# Patient Record
Sex: Female | Born: 1945 | Race: White | Hispanic: No | Smoking: Former smoker
Health system: Southern US, Community
[De-identification: ages and names within clinical notes are randomized; demographics above are authoritative.]

## PROBLEM LIST (undated history)

## (undated) DIAGNOSIS — D649 Anemia, unspecified: Secondary | ICD-10-CM

## (undated) DIAGNOSIS — H40009 Preglaucoma, unspecified, unspecified eye: Secondary | ICD-10-CM

## (undated) DIAGNOSIS — F32A Depression, unspecified: Secondary | ICD-10-CM

## (undated) DIAGNOSIS — Z87442 Personal history of urinary calculi: Secondary | ICD-10-CM

## (undated) DIAGNOSIS — K831 Obstruction of bile duct: Secondary | ICD-10-CM

## (undated) DIAGNOSIS — I6523 Occlusion and stenosis of bilateral carotid arteries: Secondary | ICD-10-CM

## (undated) DIAGNOSIS — M1990 Unspecified osteoarthritis, unspecified site: Secondary | ICD-10-CM

## (undated) DIAGNOSIS — R7303 Prediabetes: Secondary | ICD-10-CM

## (undated) DIAGNOSIS — H269 Unspecified cataract: Secondary | ICD-10-CM

## (undated) DIAGNOSIS — F419 Anxiety disorder, unspecified: Secondary | ICD-10-CM

## (undated) DIAGNOSIS — E785 Hyperlipidemia, unspecified: Secondary | ICD-10-CM

## (undated) DIAGNOSIS — I480 Paroxysmal atrial fibrillation: Secondary | ICD-10-CM

## (undated) DIAGNOSIS — I499 Cardiac arrhythmia, unspecified: Secondary | ICD-10-CM

## (undated) DIAGNOSIS — K219 Gastro-esophageal reflux disease without esophagitis: Secondary | ICD-10-CM

## (undated) DIAGNOSIS — E119 Type 2 diabetes mellitus without complications: Secondary | ICD-10-CM

## (undated) DIAGNOSIS — E039 Hypothyroidism, unspecified: Secondary | ICD-10-CM

## (undated) DIAGNOSIS — Z87891 Personal history of nicotine dependence: Principal | ICD-10-CM

## (undated) HISTORY — PX: CARDIAC ELECTROPHYSIOLOGY MAPPING AND ABLATION: SHX1292

## (undated) HISTORY — DX: Hypothyroidism, unspecified: E03.9

## (undated) HISTORY — DX: Hyperlipidemia, unspecified: E78.5

## (undated) HISTORY — PX: APPENDECTOMY: SHX54

## (undated) HISTORY — DX: Personal history of nicotine dependence: Z87.891

## (undated) HISTORY — PX: ABDOMINAL HYSTERECTOMY: SHX81

---

## 1971-12-31 HISTORY — PX: APPENDECTOMY: SHX54

## 1982-12-30 HISTORY — PX: ABDOMINAL HYSTERECTOMY: SHX81

## 2005-04-23 ENCOUNTER — Emergency Department: Payer: Self-pay | Admitting: General Practice

## 2005-08-27 ENCOUNTER — Ambulatory Visit: Payer: Self-pay | Admitting: Obstetrics and Gynecology

## 2006-08-28 ENCOUNTER — Ambulatory Visit: Payer: Self-pay | Admitting: Obstetrics and Gynecology

## 2006-11-03 ENCOUNTER — Other Ambulatory Visit: Payer: Self-pay

## 2006-11-03 ENCOUNTER — Emergency Department: Payer: Self-pay | Admitting: Emergency Medicine

## 2006-12-30 HISTORY — PX: COLONOSCOPY WITH ESOPHAGOGASTRODUODENOSCOPY (EGD): SHX5779

## 2006-12-30 HISTORY — PX: COLON SURGERY: SHX602

## 2007-02-26 ENCOUNTER — Ambulatory Visit: Payer: Self-pay | Admitting: Internal Medicine

## 2007-02-28 ENCOUNTER — Ambulatory Visit: Payer: Self-pay | Admitting: Internal Medicine

## 2007-03-31 ENCOUNTER — Ambulatory Visit: Payer: Self-pay | Admitting: Internal Medicine

## 2007-04-30 ENCOUNTER — Ambulatory Visit: Payer: Self-pay | Admitting: Internal Medicine

## 2007-07-15 ENCOUNTER — Ambulatory Visit: Payer: Self-pay | Admitting: Unknown Physician Specialty

## 2007-08-04 ENCOUNTER — Ambulatory Visit: Payer: Self-pay | Admitting: Unknown Physician Specialty

## 2007-08-26 ENCOUNTER — Other Ambulatory Visit: Payer: Self-pay

## 2007-08-26 ENCOUNTER — Ambulatory Visit: Payer: Self-pay | Admitting: General Surgery

## 2007-09-02 ENCOUNTER — Inpatient Hospital Stay: Payer: Self-pay | Admitting: General Surgery

## 2007-09-04 ENCOUNTER — Other Ambulatory Visit: Payer: Self-pay

## 2007-10-21 ENCOUNTER — Ambulatory Visit: Payer: Self-pay

## 2007-11-23 ENCOUNTER — Other Ambulatory Visit: Payer: Self-pay

## 2007-11-23 ENCOUNTER — Emergency Department: Payer: Self-pay | Admitting: Emergency Medicine

## 2008-01-11 ENCOUNTER — Other Ambulatory Visit: Payer: Self-pay

## 2008-01-11 ENCOUNTER — Emergency Department: Payer: Self-pay | Admitting: Internal Medicine

## 2008-06-03 ENCOUNTER — Ambulatory Visit: Payer: Self-pay | Admitting: Internal Medicine

## 2008-11-12 ENCOUNTER — Emergency Department: Payer: Self-pay | Admitting: Emergency Medicine

## 2008-12-14 ENCOUNTER — Ambulatory Visit: Payer: Self-pay | Admitting: Obstetrics and Gynecology

## 2009-11-16 ENCOUNTER — Emergency Department: Payer: Self-pay | Admitting: Unknown Physician Specialty

## 2009-12-21 ENCOUNTER — Ambulatory Visit: Payer: Self-pay | Admitting: Obstetrics and Gynecology

## 2010-12-25 ENCOUNTER — Ambulatory Visit: Payer: Self-pay | Admitting: Obstetrics and Gynecology

## 2010-12-30 HISTORY — PX: TRIGGER FINGER RELEASE: SHX641

## 2011-06-25 ENCOUNTER — Ambulatory Visit: Payer: Self-pay | Admitting: Internal Medicine

## 2011-12-30 ENCOUNTER — Ambulatory Visit: Payer: Self-pay | Admitting: Obstetrics and Gynecology

## 2012-03-26 ENCOUNTER — Emergency Department: Payer: Self-pay | Admitting: Emergency Medicine

## 2012-03-26 LAB — CBC
MCH: 29.6 pg (ref 26.0–34.0)
MCHC: 33.8 g/dL (ref 32.0–36.0)
Platelet: 255 10*3/uL (ref 150–440)
RBC: 4.27 10*6/uL (ref 3.80–5.20)
RDW: 13.3 % (ref 11.5–14.5)
WBC: 10.3 10*3/uL (ref 3.6–11.0)

## 2012-03-26 LAB — BASIC METABOLIC PANEL
Anion Gap: 12 (ref 7–16)
BUN: 14 mg/dL (ref 7–18)
EGFR (African American): >60
EGFR (Non-African Amer.): >60
Glucose: 148 mg/dL — ABNORMAL HIGH (ref 65–99)
Osmolality: 286 (ref 275–301)
Potassium: 4 mmol/L (ref 3.5–5.1)
Sodium: 142 mmol/L (ref 136–145)

## 2012-03-26 LAB — TSH: Thyroid Stimulating Horm: 3.14 u[IU]/mL

## 2012-03-26 LAB — TROPONIN I: Troponin-I: <0.02 ng/mL

## 2012-04-16 ENCOUNTER — Ambulatory Visit: Payer: Self-pay | Admitting: Cardiology

## 2012-10-09 ENCOUNTER — Ambulatory Visit: Payer: Self-pay

## 2012-10-30 ENCOUNTER — Ambulatory Visit: Payer: Self-pay

## 2012-11-29 ENCOUNTER — Ambulatory Visit: Payer: Self-pay

## 2012-12-30 DIAGNOSIS — B9681 Helicobacter pylori [H. pylori] as the cause of diseases classified elsewhere: Secondary | ICD-10-CM

## 2012-12-30 DIAGNOSIS — K259 Gastric ulcer, unspecified as acute or chronic, without hemorrhage or perforation: Secondary | ICD-10-CM

## 2012-12-30 DIAGNOSIS — I471 Supraventricular tachycardia, unspecified: Secondary | ICD-10-CM

## 2012-12-30 HISTORY — DX: Gastric ulcer, unspecified as acute or chronic, without hemorrhage or perforation: K25.9

## 2012-12-30 HISTORY — DX: Supraventricular tachycardia, unspecified: I47.10

## 2012-12-30 HISTORY — PX: CARDIAC ELECTROPHYSIOLOGY MAPPING AND ABLATION: SHX1292

## 2012-12-30 HISTORY — DX: Helicobacter pylori (H. pylori) as the cause of diseases classified elsewhere: B96.81

## 2012-12-31 ENCOUNTER — Ambulatory Visit: Payer: Self-pay | Admitting: Obstetrics and Gynecology

## 2013-01-11 ENCOUNTER — Emergency Department: Payer: Self-pay | Admitting: Emergency Medicine

## 2013-01-11 LAB — CBC
MCH: 29.3 pg (ref 26.0–34.0)
RBC: 4.22 10*6/uL (ref 3.80–5.20)
RDW: 13.8 % (ref 11.5–14.5)
WBC: 11.7 10*3/uL — ABNORMAL HIGH (ref 3.6–11.0)

## 2013-01-11 LAB — COMPREHENSIVE METABOLIC PANEL
Alkaline Phosphatase: 195 U/L — ABNORMAL HIGH (ref 50–136)
Anion Gap: 8 (ref 7–16)
BUN: 15 mg/dL (ref 7–18)
Bilirubin,Total: 0.2 mg/dL (ref 0.2–1.0)
Chloride: 108 mmol/L — ABNORMAL HIGH (ref 98–107)
Co2: 25 mmol/L (ref 21–32)
Creatinine: 0.7 mg/dL (ref 0.60–1.30)
EGFR (African American): >60
Osmolality: 283 (ref 275–301)
SGOT(AST): 26 U/L (ref 15–37)
SGPT (ALT): 31 U/L (ref 12–78)
Sodium: 141 mmol/L (ref 136–145)

## 2013-01-11 LAB — URINALYSIS, COMPLETE
Bacteria: NONE SEEN
Blood: NEGATIVE
Ketone: NEGATIVE
Nitrite: NEGATIVE
Protein: NEGATIVE
Specific Gravity: 1.014 (ref 1.003–1.030)
WBC UR: 3 /HPF (ref 0–5)

## 2013-01-11 LAB — TROPONIN I: Troponin-I: <0.02 ng/mL

## 2013-05-28 ENCOUNTER — Emergency Department: Payer: Self-pay | Admitting: Emergency Medicine

## 2013-05-28 LAB — CBC
HGB: 13 g/dL (ref 12.0–16.0)
MCHC: 34.2 g/dL (ref 32.0–36.0)
MCV: 87 fL (ref 80–100)
Platelet: 198 10*3/uL (ref 150–440)
RDW: 13.6 % (ref 11.5–14.5)
WBC: 9.9 10*3/uL (ref 3.6–11.0)

## 2013-05-28 LAB — TROPONIN I: Troponin-I: <0.02 ng/mL

## 2013-05-28 LAB — TSH: Thyroid Stimulating Horm: 3.1 u[IU]/mL

## 2013-06-15 DIAGNOSIS — I471 Supraventricular tachycardia, unspecified: Secondary | ICD-10-CM | POA: Insufficient documentation

## 2013-06-15 DIAGNOSIS — E785 Hyperlipidemia, unspecified: Secondary | ICD-10-CM | POA: Insufficient documentation

## 2013-06-15 DIAGNOSIS — R002 Palpitations: Secondary | ICD-10-CM | POA: Insufficient documentation

## 2013-10-29 ENCOUNTER — Ambulatory Visit: Payer: Self-pay | Admitting: Orthopedic Surgery

## 2014-12-30 DIAGNOSIS — K769 Liver disease, unspecified: Secondary | ICD-10-CM

## 2014-12-30 HISTORY — DX: Liver disease, unspecified: K76.9

## 2015-01-09 ENCOUNTER — Ambulatory Visit: Payer: Self-pay | Admitting: Orthopedic Surgery

## 2015-01-16 DIAGNOSIS — F411 Generalized anxiety disorder: Secondary | ICD-10-CM | POA: Insufficient documentation

## 2015-01-16 DIAGNOSIS — G8929 Other chronic pain: Secondary | ICD-10-CM | POA: Insufficient documentation

## 2015-01-16 DIAGNOSIS — R7989 Other specified abnormal findings of blood chemistry: Secondary | ICD-10-CM | POA: Insufficient documentation

## 2015-08-24 ENCOUNTER — Encounter: Payer: Self-pay | Admitting: Family Medicine

## 2015-08-24 ENCOUNTER — Other Ambulatory Visit: Payer: Self-pay | Admitting: Family Medicine

## 2015-08-24 DIAGNOSIS — Z87891 Personal history of nicotine dependence: Secondary | ICD-10-CM | POA: Insufficient documentation

## 2015-08-24 HISTORY — DX: Personal history of nicotine dependence: Z87.891

## 2015-08-25 ENCOUNTER — Ambulatory Visit
Admission: RE | Admit: 2015-08-25 | Discharge: 2015-08-25 | Disposition: A | Discharge status: Home / Self Care | Payer: Medicare Other | Source: Ambulatory Visit | Attending: Family Medicine | Admitting: Family Medicine

## 2015-08-25 ENCOUNTER — Inpatient Hospital Stay: Payer: Medicare Other | Attending: Family Medicine | Admitting: Family Medicine

## 2015-08-25 DIAGNOSIS — N200 Calculus of kidney: Secondary | ICD-10-CM | POA: Insufficient documentation

## 2015-08-25 DIAGNOSIS — I2510 Atherosclerotic heart disease of native coronary artery without angina pectoris: Secondary | ICD-10-CM | POA: Diagnosis not present

## 2015-08-25 DIAGNOSIS — Z122 Encounter for screening for malignant neoplasm of respiratory organs: Secondary | ICD-10-CM

## 2015-08-25 DIAGNOSIS — Z87891 Personal history of nicotine dependence: Secondary | ICD-10-CM | POA: Insufficient documentation

## 2015-08-25 DIAGNOSIS — K7689 Other specified diseases of liver: Secondary | ICD-10-CM | POA: Diagnosis not present

## 2015-08-25 NOTE — Progress Notes (Signed)
In accordance with CMS guidelines, patient has meet eligibility criteria including age, absence of signs or symptoms of lung cancer, the specific calculation of cigarette smoking pack-years was 40 years and is a former smoker having quit 10 years ago, 2006.   A shared decision-making session was conducted prior to the performance of CT scan. This includes one or more decision aids, includes benefits and harms of screening, follow-up diagnostic testing, over-diagnosis, false positive rate, and total radiation exposure.  Counseling on the importance of adherence to annual lung cancer LDCT screening, impact of co-morbidities, and ability or willingness to undergo diagnosis and treatment is imperative for compliance of the program.  Counseling on the importance of continued smoking cessation for former smokers; the importance of smoking cessation for current smokers and information about tobacco cessation interventions have been given to patient including the [place] [facility], [address] quit [place], as well as Cancer Center specific smoking cessation programs.  Written order for lung cancer screening with LDCT has been given to the patient and any and all questions have been answered to the best of my abilities.   Yearly follow up will be scheduled by Glenna Fellows, Thoracic Navigator.

## 2015-08-28 ENCOUNTER — Telehealth: Payer: Self-pay | Admitting: *Deleted

## 2015-08-28 NOTE — Telephone Encounter (Signed)
  Oncology Nurse Navigator Documentation    Navigator Encounter Type: Telephone;Screening (08/28/15 1100)                      Time Spent with Patient: 15 (08/28/15 1100)   Notified patient of LDCT lung cancer screening results of Lung Rads * finding with recommendation for 12 month follow up imaging. Also notified of incidental finding noted below. Patient verbalizes understanding.   1. Lung-RADS Category 2S, benign appearance or behavior. Continue annual screening with low-dose chest CT without contrast in 12 months. 2. The "S" modifier above refers to potentially clinically significant non lung cancer related findings. Specifically, there is extensive atherosclerosis, including left main and 2 vessel coronary artery disease. Please note that although the presence of coronary artery calcium documents the presence of coronary artery disease, the severity of this disease and any potential stenosis cannot be assessed on this non-gated CT examination. Assessment for potential risk factor modification, dietary therapy or pharmacologic therapy may be warranted, if clinically indicated. 3. In addition, there is a very large well-defined low-attenuation lesion in the liver which appears to of increased in size compared to remote prior study 08/04/2007. On the prior examination, this appeared to represent a simple cyst. This could be further evaluated with abdominal ultrasound to ensure a benign appearance. 4. Nonobstructive calculi in the visualized upper pole collecting systems of the kidneys bilaterally measuring up to 6 mm.

## 2015-09-05 ENCOUNTER — Other Ambulatory Visit: Payer: Self-pay | Admitting: Infectious Diseases

## 2015-09-05 DIAGNOSIS — K769 Liver disease, unspecified: Secondary | ICD-10-CM

## 2015-09-12 ENCOUNTER — Ambulatory Visit
Admission: RE | Admit: 2015-09-12 | Discharge: 2015-09-12 | Disposition: A | Discharge status: Home / Self Care | Payer: Medicare Other | Source: Ambulatory Visit | Attending: Infectious Diseases | Admitting: Infectious Diseases

## 2015-09-12 DIAGNOSIS — K7689 Other specified diseases of liver: Secondary | ICD-10-CM | POA: Insufficient documentation

## 2015-09-12 DIAGNOSIS — K769 Liver disease, unspecified: Secondary | ICD-10-CM

## 2015-11-06 ENCOUNTER — Other Ambulatory Visit: Payer: Self-pay | Admitting: Orthopedic Surgery

## 2015-11-06 DIAGNOSIS — M25511 Pain in right shoulder: Secondary | ICD-10-CM

## 2015-11-17 ENCOUNTER — Ambulatory Visit
Admission: RE | Admit: 2015-11-17 | Discharge: 2015-11-17 | Disposition: A | Discharge status: Home / Self Care | Payer: Medicare Other | Source: Ambulatory Visit | Attending: Orthopedic Surgery | Admitting: Orthopedic Surgery

## 2015-11-17 DIAGNOSIS — S46811A Strain of other muscles, fascia and tendons at shoulder and upper arm level, right arm, initial encounter: Secondary | ICD-10-CM | POA: Insufficient documentation

## 2015-11-17 DIAGNOSIS — X58XXXA Exposure to other specified factors, initial encounter: Secondary | ICD-10-CM | POA: Insufficient documentation

## 2015-11-17 DIAGNOSIS — M25511 Pain in right shoulder: Secondary | ICD-10-CM

## 2016-09-04 ENCOUNTER — Telehealth: Payer: Self-pay | Admitting: *Deleted

## 2016-09-04 NOTE — Telephone Encounter (Signed)
Notified patient that annual lung cancer screening low dose CT scan is due. Confirmed that patient is within the age range of 55-77, and asymptomatic, (no signs or symptoms of lung cancer). Patient denies illness that would prevent curative treatment for lung cancer if found. The patient is a former smoker, quit 2006, with a 40 pack year history. The shared decision making visit was done 08/25/15. Patient is agreeable for CT scan being scheduled.

## 2016-09-11 ENCOUNTER — Other Ambulatory Visit: Payer: Self-pay | Admitting: *Deleted

## 2016-09-11 DIAGNOSIS — Z87891 Personal history of nicotine dependence: Secondary | ICD-10-CM

## 2016-09-18 ENCOUNTER — Ambulatory Visit
Admission: RE | Admit: 2016-09-18 | Discharge: 2016-09-18 | Disposition: A | Discharge status: Home / Self Care | Payer: Medicare Other | Source: Ambulatory Visit | Attending: Oncology | Admitting: Oncology

## 2016-09-18 DIAGNOSIS — N200 Calculus of kidney: Secondary | ICD-10-CM | POA: Diagnosis not present

## 2016-09-18 DIAGNOSIS — I2510 Atherosclerotic heart disease of native coronary artery without angina pectoris: Secondary | ICD-10-CM | POA: Diagnosis not present

## 2016-09-18 DIAGNOSIS — K769 Liver disease, unspecified: Secondary | ICD-10-CM | POA: Diagnosis not present

## 2016-09-18 DIAGNOSIS — Z87891 Personal history of nicotine dependence: Secondary | ICD-10-CM | POA: Insufficient documentation

## 2016-09-18 DIAGNOSIS — I700 Atherosclerosis of aorta: Secondary | ICD-10-CM | POA: Insufficient documentation

## 2016-10-09 ENCOUNTER — Telehealth: Payer: Self-pay | Admitting: *Deleted

## 2016-10-09 NOTE — Telephone Encounter (Signed)
Voicemail left to notifiy patient of LDCT lung cancer screening results with recommendation for 12 month follow up imaging. Also upon calling back, patient will be  notified of incidental finding noted below. Patient verbalizes understanding. Copy of this note will be sent to PCP via Epic.  IMPRESSION: 1. Lung-RADS Category 2, benign appearance or behavior. Continue annual screening with low-dose chest CT without contrast in 12 months. 2.  Coronary artery atherosclerosis. Aortic atherosclerosis. 3. 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 Incompletely imaged and similar to decreased. 4. Nephrolithiasis.

## 2017-01-30 ENCOUNTER — Ambulatory Visit (INDEPENDENT_AMBULATORY_CARE_PROVIDER_SITE_OTHER): Payer: Self-pay | Admitting: Vascular Surgery

## 2017-01-30 ENCOUNTER — Encounter (INDEPENDENT_AMBULATORY_CARE_PROVIDER_SITE_OTHER): Payer: Self-pay

## 2017-02-13 ENCOUNTER — Other Ambulatory Visit (INDEPENDENT_AMBULATORY_CARE_PROVIDER_SITE_OTHER): Payer: Self-pay | Admitting: Vascular Surgery

## 2017-02-13 ENCOUNTER — Ambulatory Visit (INDEPENDENT_AMBULATORY_CARE_PROVIDER_SITE_OTHER): Payer: Medicare Other

## 2017-02-13 ENCOUNTER — Ambulatory Visit (INDEPENDENT_AMBULATORY_CARE_PROVIDER_SITE_OTHER): Payer: Medicare Other | Admitting: Vascular Surgery

## 2017-02-13 ENCOUNTER — Encounter (INDEPENDENT_AMBULATORY_CARE_PROVIDER_SITE_OTHER): Payer: Self-pay | Admitting: Vascular Surgery

## 2017-02-13 DIAGNOSIS — K219 Gastro-esophageal reflux disease without esophagitis: Secondary | ICD-10-CM | POA: Insufficient documentation

## 2017-02-13 DIAGNOSIS — E039 Hypothyroidism, unspecified: Secondary | ICD-10-CM | POA: Diagnosis not present

## 2017-02-13 DIAGNOSIS — I6529 Occlusion and stenosis of unspecified carotid artery: Secondary | ICD-10-CM

## 2017-02-13 DIAGNOSIS — E7800 Pure hypercholesterolemia, unspecified: Secondary | ICD-10-CM | POA: Insufficient documentation

## 2017-02-13 DIAGNOSIS — I6523 Occlusion and stenosis of bilateral carotid arteries: Secondary | ICD-10-CM | POA: Diagnosis not present

## 2017-02-13 LAB — VAS US CAROTID
LCCADDIAS: 17 cm/s
LCCAPSYS: 79 cm/s
LEFT ECA DIAS: -6 cm/s
Left CCA dist sys: 64 cm/s
Left CCA prox dias: 10 cm/s
Left ICA dist dias: -15 cm/s
Left ICA dist sys: -46 cm/s
Left ICA prox dias: 50 cm/s
Left ICA prox sys: 155 cm/s
RCCADSYS: -61 cm/s
RCCAPDIAS: 11 cm/s
RCCAPSYS: 95 cm/s
RIGHT CCA MID DIAS: -16 cm/s
RIGHT ECA DIAS: -6 cm/s

## 2017-02-13 NOTE — Progress Notes (Signed)
MRN : [id number]  Lauren White is a 71 y.o. (06-18-1946) female who presents with chief complaint of  Chief Complaint  Patient presents with  . Follow-up  .  History of Present Illness:The patient is seen for follow up evaluation of carotid stenosis. The carotid stenosis followed by ultrasound.   The patient denies amaurosis fugax. There is no recent history of TIA symptoms or focal motor deficits. There is no prior documented CVA.  The patient is taking enteric-coated aspirin 81 mg daily.  There is no history of migraine headaches. There is no history of seizures.  The patient has a history of coronary artery disease, no recent episodes of angina or shortness of breath. The patient denies PAD or claudication symptoms. There is a history of hyperlipidemia which is being treated with a statin.    Carotid Duplex done today shows RICA <30% and LICA 40-59%.  No change compared to last study in 01/29/2016  Current Meds  Medication Sig  . aspirin EC 81 MG tablet Take by mouth.  Marland Kitchen. atorvastatin (LIPITOR) 20 MG tablet   . Calcium Carbonate-Vitamin D 600-400 MG-UNIT tablet Take by mouth.  . citalopram (CELEXA) 20 MG tablet Take 20 mg by mouth daily.  . lansoprazole (PREVACID) 15 MG capsule Take by mouth.  . levothyroxine (SYNTHROID, LEVOTHROID) 50 MCG tablet Take by mouth.  . Magnesium Oxide 250 MG TABS Take by mouth.  . Multiple Vitamin (MULTI-VITAMINS) TABS Take by mouth.  . naproxen sodium (ANAPROX) 220 MG tablet Take by mouth.  . Omega-3 Fatty Acids (FISH OIL PO) Take by mouth.    Past Medical History:  Diagnosis Date  . Hyperlipidemia   . Hypothyroidism   . Personal history of tobacco use, presenting hazards to health 08/24/2015    Past Surgical History:  Procedure Laterality Date  . ABDOMINAL HYSTERECTOMY    . APPENDECTOMY    . CARDIAC ELECTROPHYSIOLOGY MAPPING AND ABLATION      Social History Social History  Substance Use Topics  . Smoking status: Never  Smoker  . Smokeless tobacco: Never Used  . Alcohol use No    Family History History reviewed. No pertinent family history. No family history of bleeding/clotting disorders, porphyria or autoimmune disease  Allergies  Allergen Reactions  . Diphenhydramine Hcl Swelling    Increases pressure in the eyes due to glaucoma  . Etodolac Other (See Comments)    Chest pain  . Omeprazole Other (See Comments)    Chest pain  . Tramadol Other (See Comments)    Difficulty breathing  . Nickel Rash  . Povidone-Iodine Rash     REVIEW OF SYSTEMS (Negative unless checked)  Constitutional: [] Weight loss  [] Fever  [] Chills Cardiac: [] Chest pain   [] Chest pressure   [] Palpitations   [] Shortness of breath when laying flat   [] Shortness of breath with exertion. Vascular:  [] Pain in legs with walking   [] Pain in legs at rest  [] History of DVT   [] Phlebitis   [] Swelling in legs   [] Varicose veins   [] Non-healing ulcers Pulmonary:   [] Uses home oxygen   [] Productive cough   [] Hemoptysis   [] Wheeze  [] COPD   [] Asthma Neurologic:  [] Dizziness   [] Seizures   [] History of stroke   [] History of TIA  [] Aphasia   [] Vissual changes   [] Weakness or numbness in arm   [] Weakness or numbness in leg Musculoskeletal:   [] Joint swelling   [x] Joint pain   [] Low back pain Hematologic:  [] Easy bruising  [] Easy bleeding   []   Hypercoagulable state   [] Anemic Gastrointestinal:  [] Diarrhea   [] Vomiting  [] Gastroesophageal reflux/heartburn   [] Difficulty swallowing. Genitourinary:  [] Chronic kidney disease   [] Difficult urination  [] Frequent urination   [] Blood in urine Skin:  [] Rashes   [] Ulcers  Psychological:  [] History of anxiety   []  History of major depression.  Physical Examination  Vitals:   02/13/17 1406  BP: 117/60  Pulse: 71  Resp: 16  Weight: 117 lb (53.1 kg)   Body mass index is 22.47 kg/m. Gen: WD/WN, NAD Head: [place]/AT, No temporalis wasting.  Ear/Nose/Throat: Hearing grossly intact, nares w/o erythema or  drainage, poor dentition Eyes: PER, EOMI, sclera nonicteric.  Neck: Supple, no masses.  No bruit or JVD.  Pulmonary:  Good air movement, clear to auscultation bilaterally, no use of accessory muscles.  Cardiac: RRR, normal S1, S2, no Murmurs. Vascular:  Left carotid bruit Vessel Right Left  Radial Palpable Palpable  Ulnar Palpable Palpable  Brachial Palpable Palpable  Carotid Palpable Palpable  Femoral Palpable Palpable  Popliteal Palpable Palpable  PT Palpable Palpable  DP Palpable Palpable   Gastrointestinal: soft, non-distended. No guarding/no peritoneal signs.  Musculoskeletal: M/S 5/5 throughout.  No deformity or atrophy.  Neurologic: CN 2-12 intact. Pain and light touch intact in extremities.  Symmetrical.  Speech is fluent. Motor exam as listed above. Psychiatric: Judgment intact, Mood & affect appropriate for pt's clinical situation. Dermatologic: No rashes or ulcers noted.  No changes consistent with cellulitis. Lymph : No Cervical lymphadenopathy, no lichenification or skin changes of chronic lymphedema.  CBC Lab Results  Component Value Date   WBC 9.9 05/28/2013   HGB 13.0 05/28/2013   HCT 37.9 05/28/2013   MCV 87 05/28/2013   PLT 198 05/28/2013    BMET    Component Value Date/Time   NA 141 01/11/2013 1558   K 3.9 01/11/2013 1558   CL 108 (H) 01/11/2013 1558   CO2 25 01/11/2013 1558   GLUCOSE 108 (H) 01/11/2013 1558   BUN 15 01/11/2013 1558   CREATININE 0.70 01/11/2013 1558   CALCIUM 8.9 01/11/2013 1558   GFRNONAA >60 01/11/2013 1558   GFRAA >60 01/11/2013 1558   CrCl cannot be calculated (Patient's most recent lab result is older than the maximum 21 days allowed.).  COAG No results found for: INR, PROTIME  Radiology No results found.   Assessment/Plan 1. Bilateral carotid artery stenosis Recommend:  Given the patient's asymptomatic subcritical stenosis no further invasive testing or surgery at this time.  Duplex ultrasound shows <50% stenosis  bilaterally.  Continue antiplatelet therapy as prescribed Continue management of CAD, HTN and Hyperlipidemia Healthy heart diet,  encouraged exercise at least 4 times per week Follow up in 12 months with duplex ultrasound and physical exam based on 50% stenosis of the Left carotid artery   2. Pure hypercholesterolemia Continue statin as ordered and reviewed, no changes at this time  3. Gastroesophageal reflux disease without esophagitis Continue PPI as already ordered, these medications have been reviewed and there are no changes at this time.   Levora Dredge, MD  02/13/2017 8:32 PM

## 2017-02-21 ENCOUNTER — Other Ambulatory Visit: Payer: Self-pay | Admitting: Infectious Diseases

## 2017-02-21 DIAGNOSIS — Z1231 Encounter for screening mammogram for malignant neoplasm of breast: Secondary | ICD-10-CM

## 2017-03-20 ENCOUNTER — Ambulatory Visit
Admission: RE | Admit: 2017-03-20 | Discharge: 2017-03-20 | Disposition: A | Discharge status: Home / Self Care | Payer: Medicare Other | Source: Ambulatory Visit | Attending: Infectious Diseases | Admitting: Infectious Diseases

## 2017-03-20 DIAGNOSIS — Z1231 Encounter for screening mammogram for malignant neoplasm of breast: Secondary | ICD-10-CM | POA: Diagnosis present

## 2017-06-02 DIAGNOSIS — I480 Paroxysmal atrial fibrillation: Secondary | ICD-10-CM | POA: Insufficient documentation

## 2017-09-15 ENCOUNTER — Telehealth: Payer: Self-pay | Admitting: *Deleted

## 2017-09-15 ENCOUNTER — Other Ambulatory Visit: Payer: Self-pay | Admitting: *Deleted

## 2017-09-15 DIAGNOSIS — Z87891 Personal history of nicotine dependence: Secondary | ICD-10-CM

## 2017-09-15 DIAGNOSIS — Z122 Encounter for screening for malignant neoplasm of respiratory organs: Secondary | ICD-10-CM

## 2017-09-15 NOTE — Telephone Encounter (Signed)
Notified patient that annual lung cancer screening low dose CT scan is due currently or will be in near future. Confirmed that patient is within the age range of 55-77, and asymptomatic, (no signs or symptoms of lung cancer). Patient denies illness that would prevent curative treatment for lung cancer if found. Verified smoking history, (former, quit 2006, 40 pack year). The shared decision making visit was done 08/25/15. Patient is agreeable for CT scan being scheduled.

## 2017-09-16 ENCOUNTER — Telehealth: Payer: Self-pay | Admitting: *Deleted

## 2017-09-16 NOTE — Telephone Encounter (Signed)
CT appointment conf with patient. MF °

## 2017-09-22 ENCOUNTER — Ambulatory Visit
Admission: RE | Admit: 2017-09-22 | Discharge: 2017-09-22 | Disposition: A | Discharge status: Home / Self Care | Payer: Medicare Other | Source: Ambulatory Visit | Attending: Oncology | Admitting: Oncology

## 2017-09-22 DIAGNOSIS — K7689 Other specified diseases of liver: Secondary | ICD-10-CM | POA: Diagnosis not present

## 2017-09-22 DIAGNOSIS — I2510 Atherosclerotic heart disease of native coronary artery without angina pectoris: Secondary | ICD-10-CM | POA: Insufficient documentation

## 2017-09-22 DIAGNOSIS — I700 Atherosclerosis of aorta: Secondary | ICD-10-CM | POA: Diagnosis not present

## 2017-09-22 DIAGNOSIS — K449 Diaphragmatic hernia without obstruction or gangrene: Secondary | ICD-10-CM | POA: Insufficient documentation

## 2017-09-22 DIAGNOSIS — Z122 Encounter for screening for malignant neoplasm of respiratory organs: Secondary | ICD-10-CM | POA: Diagnosis present

## 2017-09-22 DIAGNOSIS — Z87891 Personal history of nicotine dependence: Secondary | ICD-10-CM | POA: Diagnosis not present

## 2017-09-22 DIAGNOSIS — N200 Calculus of kidney: Secondary | ICD-10-CM | POA: Insufficient documentation

## 2017-09-29 ENCOUNTER — Encounter: Payer: Self-pay | Admitting: *Deleted

## 2018-02-16 ENCOUNTER — Encounter (INDEPENDENT_AMBULATORY_CARE_PROVIDER_SITE_OTHER): Payer: Medicare Other

## 2018-02-16 ENCOUNTER — Ambulatory Visit (INDEPENDENT_AMBULATORY_CARE_PROVIDER_SITE_OTHER): Payer: Medicare Other | Admitting: Vascular Surgery

## 2018-02-24 ENCOUNTER — Other Ambulatory Visit: Payer: Self-pay | Admitting: Infectious Diseases

## 2018-02-24 DIAGNOSIS — Z1231 Encounter for screening mammogram for malignant neoplasm of breast: Secondary | ICD-10-CM

## 2018-03-12 ENCOUNTER — Ambulatory Visit (INDEPENDENT_AMBULATORY_CARE_PROVIDER_SITE_OTHER): Payer: Medicare Other

## 2018-03-12 ENCOUNTER — Ambulatory Visit (INDEPENDENT_AMBULATORY_CARE_PROVIDER_SITE_OTHER): Payer: Medicare Other | Admitting: Vascular Surgery

## 2018-03-12 ENCOUNTER — Encounter (INDEPENDENT_AMBULATORY_CARE_PROVIDER_SITE_OTHER): Payer: Self-pay | Admitting: Vascular Surgery

## 2018-03-12 VITALS — BP 132/74 | HR 76 | Resp 18 | Wt 116.4 lb

## 2018-03-12 DIAGNOSIS — I6523 Occlusion and stenosis of bilateral carotid arteries: Secondary | ICD-10-CM

## 2018-03-12 DIAGNOSIS — K219 Gastro-esophageal reflux disease without esophagitis: Secondary | ICD-10-CM

## 2018-03-12 DIAGNOSIS — E039 Hypothyroidism, unspecified: Secondary | ICD-10-CM

## 2018-03-12 DIAGNOSIS — E7800 Pure hypercholesterolemia, unspecified: Secondary | ICD-10-CM | POA: Diagnosis not present

## 2018-03-13 ENCOUNTER — Encounter (INDEPENDENT_AMBULATORY_CARE_PROVIDER_SITE_OTHER): Payer: Self-pay | Admitting: Vascular Surgery

## 2018-03-13 NOTE — Progress Notes (Signed)
MRN : [id number]  Lauren White is a 72 y.o. (03-Sep-1946) female who presents with chief complaint of  Chief Complaint  Patient presents with  . Carotid    10yr follow up  .  History of Present Illness:The patient is seen for follow up evaluation of carotid stenosis. The carotid stenosis followed by ultrasound.   The patient denies amaurosis fugax. There is no recent history of TIA symptoms or focal motor deficits. There is no prior documented CVA.  The patient is taking enteric-coated aspirin 81 mg daily.  There is no history of migraine headaches. There is no history of seizures.  The patient has a history of coronary artery disease, no recent episodes of angina or shortness of breath. The patient denies PAD or claudication symptoms. There is a history of hyperlipidemia which is being treated with a statin.    Carotid Duplex done today shows 40-59% stenosis bilaterally.  No change compared to last study.  Current Meds  Medication Sig  . aspirin EC 81 MG tablet Take by mouth.  Marland Kitchen atorvastatin (LIPITOR) 20 MG tablet   . Calcium Carbonate-Vitamin D 600-400 MG-UNIT tablet Take by mouth.  . citalopram (CELEXA) 20 MG tablet Take 20 mg by mouth daily.  . lansoprazole (PREVACID) 15 MG capsule Take by mouth.  . levothyroxine (SYNTHROID, LEVOTHROID) 75 MCG tablet Take by mouth.  . Magnesium Oxide 250 MG TABS Take by mouth.  . Multiple Vitamin (MULTI-VITAMINS) TABS Take by mouth.  . naproxen sodium (ANAPROX) 220 MG tablet Take by mouth.  . Omega-3 Fatty Acids (FISH OIL PO) Take by mouth.    Past Medical History:  Diagnosis Date  . Hyperlipidemia   . Hypothyroidism   . Personal history of tobacco use, presenting hazards to health 08/24/2015    Past Surgical History:  Procedure Laterality Date  . ABDOMINAL HYSTERECTOMY    . APPENDECTOMY    . CARDIAC ELECTROPHYSIOLOGY MAPPING AND ABLATION      Social History Social History   Tobacco Use  . Smoking status: Never  Smoker  . Smokeless tobacco: Never Used  Substance Use Topics  . Alcohol use: No  . Drug use: No    Family History Family History  Problem Relation Age of Onset  . Breast cancer Neg Hx     Allergies  Allergen Reactions  . Diphenhydramine Hcl Swelling    Increases pressure in the eyes due to glaucoma  . Etodolac Other (See Comments)    Chest pain  . Omeprazole Other (See Comments)    Chest pain  . Tramadol Other (See Comments)    Difficulty breathing  . Nickel Rash  . Povidone-Iodine Rash     REVIEW OF SYSTEMS (Negative unless checked)  Constitutional: [] Weight loss  [] Fever  [] Chills Cardiac: [] Chest pain   [] Chest pressure   [] Palpitations   [] Shortness of breath when laying flat   [] Shortness of breath with exertion. Vascular:  [] Pain in legs with walking   [] Pain in legs at rest  [] History of DVT   [] Phlebitis   [] Swelling in legs   [] Varicose veins   [] Non-healing ulcers Pulmonary:   [] Uses home oxygen   [] Productive cough   [] Hemoptysis   [] Wheeze  [] COPD   [] Asthma Neurologic:  [] Dizziness   [] Seizures   [] History of stroke   [] History of TIA  [] Aphasia   [] Vissual changes   [] Weakness or numbness in arm   [] Weakness or numbness in leg Musculoskeletal:   [] Joint swelling   [] Joint pain   []   Low back pain Hematologic:  [] Easy bruising  [][id number]$ Easy bleeding   [] Hypercoagulable state   [] Anemic Gastrointestinal:  [] Diarrhea   [] Vomiting  [] Gastroesophageal reflux/heartburn   [] Difficulty swallowing. Genitourinary:  [] Chronic kidney disease   [] Difficult urination  [] Frequent urination   [] Blood in urine Skin:  [] Rashes   [] Ulcers  Psychological:  [] History of anxiety   []  History of major depression.  Physical Examination  Vitals:   03/12/18 1432 03/12/18 1433  BP: 138/72 132/74  Pulse: 76   Resp: 18   Weight: 116 lb 6.4 oz (52.8 kg)    Body mass index is 22.36 kg/m. Gen: WD/WN, NAD Head: [place]/AT, No temporalis wasting.  Ear/Nose/Throat: Hearing grossly intact, nares  w/o erythema or drainage Eyes: PER, EOMI, sclera nonicteric.  Neck: Supple, no large masses.   Pulmonary:  Good air movement, no audible wheezing bilaterally, no use of accessory muscles.  Cardiac: RRR, no JVD Vascular: bilateral carotid bruits Vessel Right Left  Radial Palpable Palpable  Ulnar Palpable Palpable  Brachial Palpable Palpable  Carotid Palpable Palpable  Gastrointestinal: Non-distended. No guarding/no peritoneal signs.  Musculoskeletal: M/S 5/5 throughout.  No deformity or atrophy.  Neurologic: CN 2-12 intact. Symmetrical.  Speech is fluent. Motor exam as listed above. Psychiatric: Judgment intact, Mood & affect appropriate for pt's clinical situation. Dermatologic: No rashes or ulcers noted.  No changes consistent with cellulitis. Lymph : No lichenification or skin changes of chronic lymphedema.  CBC Lab Results  Component Value Date   WBC 9.9 05/28/2013   HGB 13.0 05/28/2013   HCT 37.9 05/28/2013   MCV 87 05/28/2013   PLT 198 05/28/2013    BMET    Component Value Date/Time   NA 141 01/11/2013 1558   K 3.9 01/11/2013 1558   CL 108 (H) 01/11/2013 1558   CO2 25 01/11/2013 1558   GLUCOSE 108 (H) 01/11/2013 1558   BUN 15 01/11/2013 1558   CREATININE 0.70 01/11/2013 1558   CALCIUM 8.9 01/11/2013 1558   GFRNONAA >60 01/11/2013 1558   GFRAA >60 01/11/2013 1558   CrCl cannot be calculated (Patient's most recent lab result is older than the maximum 21 days allowed.).  COAG No results found for: INR, PROTIME  Radiology No results found.   Assessment/Plan 1. Bilateral carotid artery stenosis Recommend:  Given the patient's asymptomatic subcritical stenosis no further invasive testing or surgery at this time.  Duplex ultrasound shows 40-59% stenosis bilaterally.  Continue antiplatelet therapy as prescribed Continue management of CAD, HTN and Hyperlipidemia Healthy heart diet,  encouraged exercise at least 4 times per week Follow up in 12 months with  duplex ultrasound and physical exam based on the 50% stenosis of the bilateral carotid artery    - VAS US CAROTID; Future  2. Gastroesophageal reflux disease without esophagitis Continue antihypertensive medications as already ordered, these medications have been reviewed and there are no changes at this time.  Avoidence of caffeine and alcohol  Moderate elevation of the head of the bed   3. Hypothyroidism, unspecified type Continue Synthroid as already ordered, these medications have been reviewed and there are no changes at this time.   4. Pure hypercholesterolemia Continue statin as ordered and reviewed, no changes at this time     Levora DredgeGregory Nikhita Mentzel, MD  03/13/2018 12:39 PM

## 2018-03-23 ENCOUNTER — Ambulatory Visit
Admission: RE | Admit: 2018-03-23 | Discharge: 2018-03-23 | Disposition: A | Discharge status: Home / Self Care | Payer: Medicare Other | Source: Ambulatory Visit | Attending: Infectious Diseases | Admitting: Infectious Diseases

## 2018-03-23 DIAGNOSIS — Z1231 Encounter for screening mammogram for malignant neoplasm of breast: Secondary | ICD-10-CM | POA: Diagnosis not present

## 2018-09-12 ENCOUNTER — Telehealth: Payer: Self-pay

## 2018-09-12 NOTE — Telephone Encounter (Signed)
Call pt regarding lung screening. [place]. Would like for us to call her back last oct.

## 2018-10-01 ENCOUNTER — Telehealth: Payer: Self-pay | Admitting: *Deleted

## 2018-10-01 ENCOUNTER — Encounter: Payer: Self-pay | Admitting: *Deleted

## 2018-10-01 DIAGNOSIS — Z122 Encounter for screening for malignant neoplasm of respiratory organs: Secondary | ICD-10-CM

## 2018-10-01 NOTE — Telephone Encounter (Signed)
Patient has been notified that the annual lung cancer screening low dose CT scan is due currently or will be in the near future.  Confirmed that the patient is within the age range of 69-80, and asymptomatic, and currently exhibits no signs or symptoms of lung cancer.  Patient denies illness that would prevent curative treatment for lung cancer if found.  Verified smoking history, former smoker 40 pkyr history .  The shared decision making visit was completed on 08-25-15.  Patient is agreeable for the CT scan to be scheduled.  Will call patient back with date and time of appointment.

## 2018-10-02 ENCOUNTER — Telehealth: Payer: Self-pay | Admitting: *Deleted

## 2018-10-02 NOTE — Telephone Encounter (Signed)
Called pt to inform her of her appt for ldct screening on Thursday 10/22/2018 @ 1:00pm here @ [facility], voiced understanding.

## 2018-10-02 NOTE — Telephone Encounter (Signed)
Error

## 2018-10-05 ENCOUNTER — Ambulatory Visit: Admission: RE | Admit: 2018-10-05 | Payer: Medicare Other | Source: Ambulatory Visit

## 2018-10-22 ENCOUNTER — Ambulatory Visit: Admission: RE | Admit: 2018-10-22 | Payer: Medicare Other | Source: Ambulatory Visit

## 2018-10-22 ENCOUNTER — Ambulatory Visit
Admission: RE | Admit: 2018-10-22 | Discharge: 2018-10-22 | Disposition: A | Discharge status: Home / Self Care | Payer: Medicare Other | Source: Ambulatory Visit | Attending: Nurse Practitioner | Admitting: Nurse Practitioner

## 2018-10-22 DIAGNOSIS — J432 Centrilobular emphysema: Secondary | ICD-10-CM | POA: Insufficient documentation

## 2018-10-22 DIAGNOSIS — N200 Calculus of kidney: Secondary | ICD-10-CM | POA: Diagnosis not present

## 2018-10-22 DIAGNOSIS — Z87891 Personal history of nicotine dependence: Secondary | ICD-10-CM | POA: Diagnosis not present

## 2018-10-22 DIAGNOSIS — I2510 Atherosclerotic heart disease of native coronary artery without angina pectoris: Secondary | ICD-10-CM | POA: Diagnosis not present

## 2018-10-22 DIAGNOSIS — Z122 Encounter for screening for malignant neoplasm of respiratory organs: Secondary | ICD-10-CM | POA: Insufficient documentation

## 2018-10-22 DIAGNOSIS — I700 Atherosclerosis of aorta: Secondary | ICD-10-CM | POA: Insufficient documentation

## 2018-10-23 ENCOUNTER — Encounter: Payer: Self-pay | Admitting: *Deleted

## 2018-11-25 IMAGING — MG MM DIGITAL SCREENING BILAT W/ CAD
4 series · 4 of 4 positions shown · non-contrast
Comparison: Previous exam(s).

CLINICAL DATA: Screening.

EXAM:
DIGITAL SCREENING BILATERAL MAMMOGRAM WITH CAD

[R MLO]
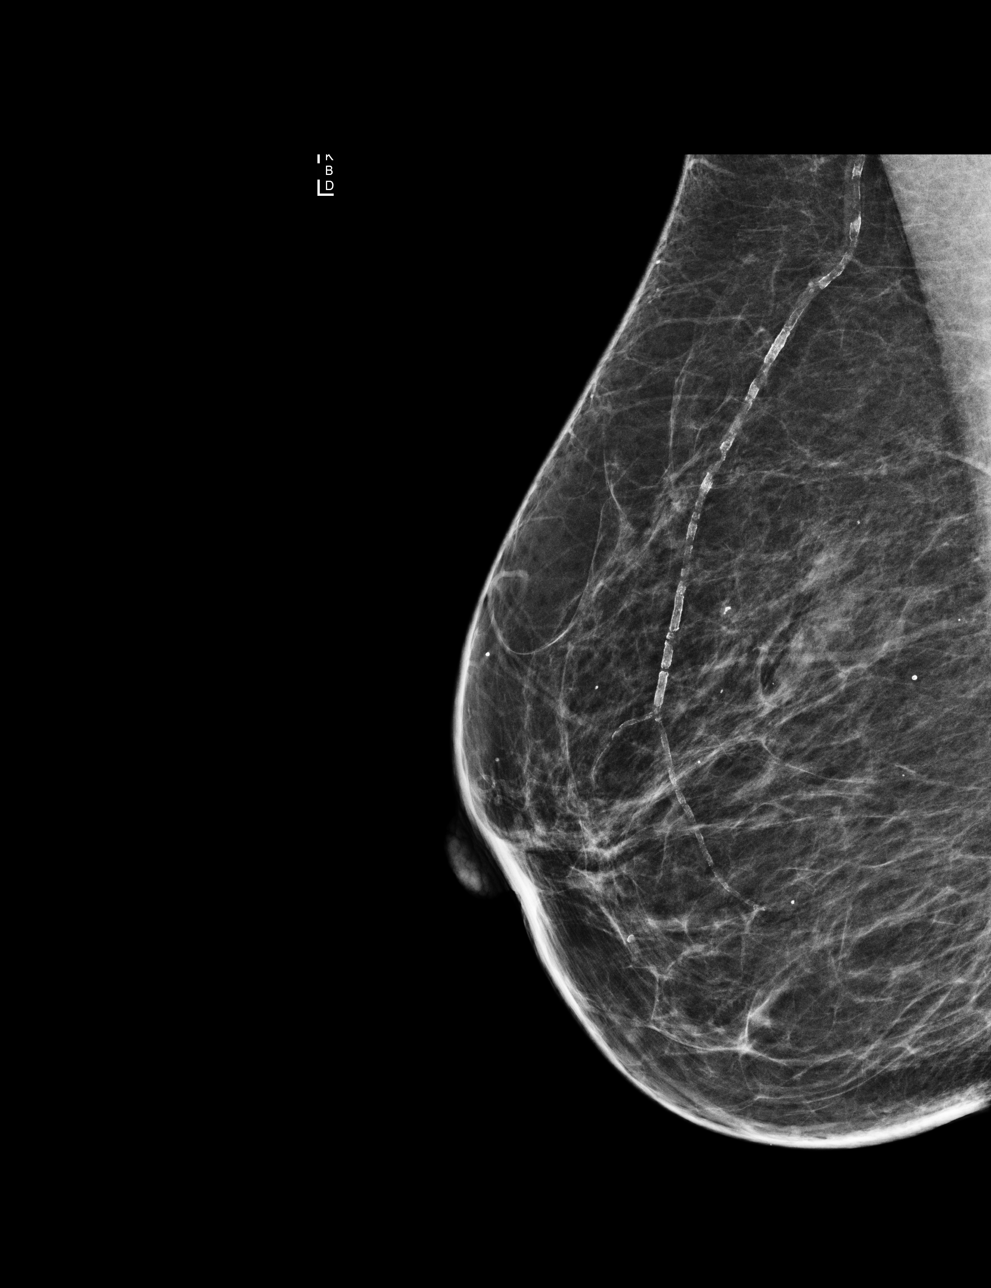

[L MLO]
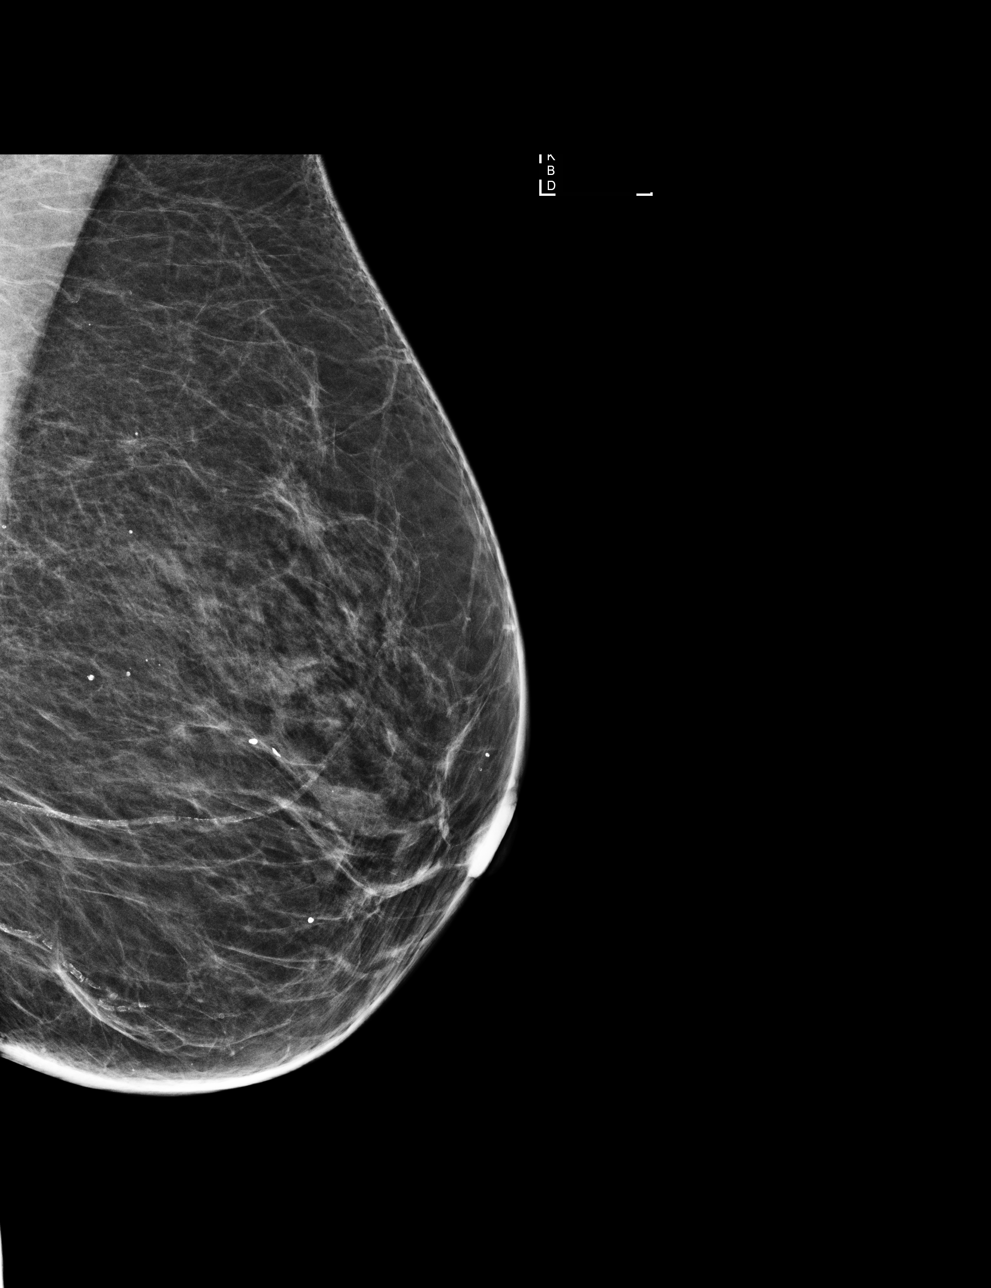

[L CC]
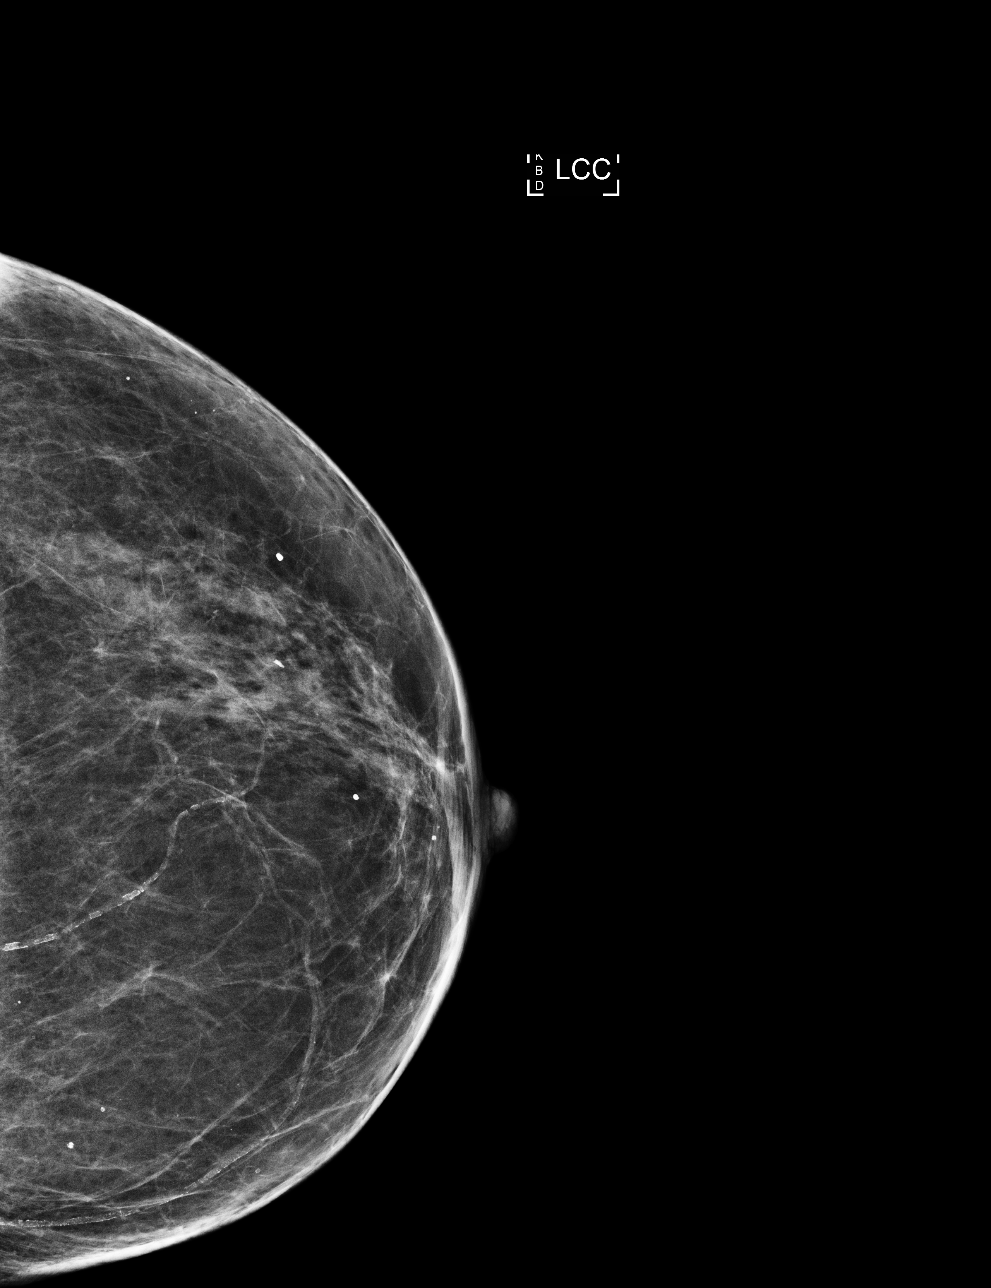

[R CC]
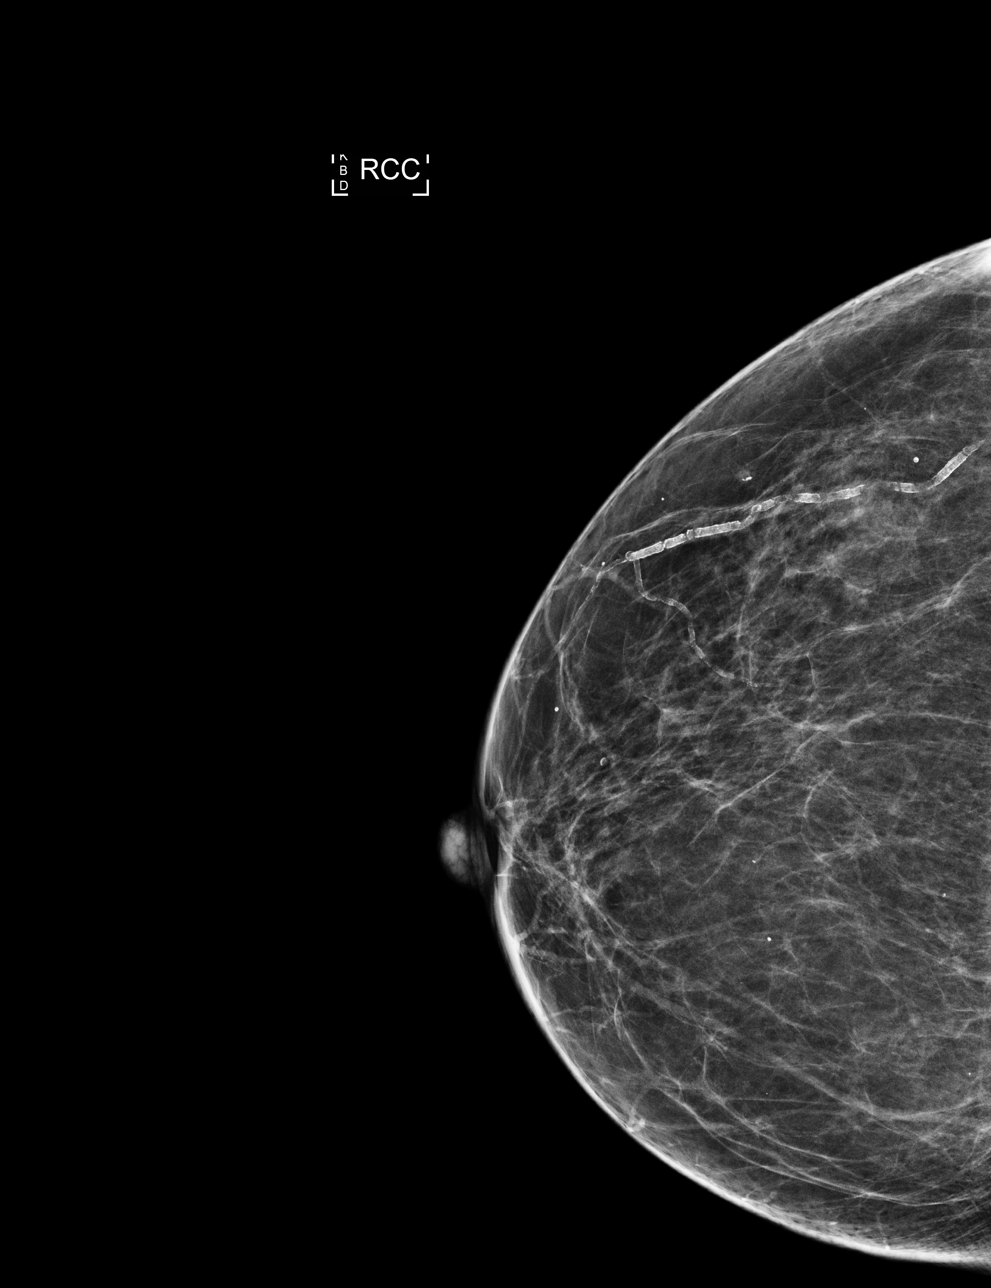

[4 of 4 positions shown; findings below may reference images not displayed]

ACR Breast Density Category b: There are scattered areas of
fibroglandular density.
FINDINGS: There are no findings suspicious for malignancy. Images were
processed with CAD.
IMPRESSION: No mammographic evidence of malignancy. A result letter of this
screening mammogram will be mailed directly to the patient.

RECOMMENDATION:
Screening mammogram in one year. (Code:AS-G-LCT)

BI-RADS CATEGORY  1: Negative.

## 2019-02-16 ENCOUNTER — Other Ambulatory Visit: Payer: Self-pay | Admitting: Internal Medicine

## 2019-02-16 DIAGNOSIS — Z1231 Encounter for screening mammogram for malignant neoplasm of breast: Secondary | ICD-10-CM

## 2019-03-15 ENCOUNTER — Encounter (INDEPENDENT_AMBULATORY_CARE_PROVIDER_SITE_OTHER): Payer: Medicare Other

## 2019-03-15 ENCOUNTER — Ambulatory Visit (INDEPENDENT_AMBULATORY_CARE_PROVIDER_SITE_OTHER): Payer: Medicare Other | Admitting: Vascular Surgery

## 2019-04-12 ENCOUNTER — Encounter (INDEPENDENT_AMBULATORY_CARE_PROVIDER_SITE_OTHER): Payer: Self-pay

## 2019-04-12 ENCOUNTER — Encounter (INDEPENDENT_AMBULATORY_CARE_PROVIDER_SITE_OTHER): Payer: Medicare Other

## 2019-04-12 ENCOUNTER — Ambulatory Visit (INDEPENDENT_AMBULATORY_CARE_PROVIDER_SITE_OTHER): Payer: Medicare Other | Admitting: Vascular Surgery

## 2019-07-08 ENCOUNTER — Other Ambulatory Visit: Payer: Self-pay

## 2019-07-08 ENCOUNTER — Ambulatory Visit
Admission: RE | Admit: 2019-07-08 | Discharge: 2019-07-08 | Disposition: A | Discharge status: Home / Self Care | Payer: Medicare Other | Source: Ambulatory Visit | Attending: Internal Medicine | Admitting: Internal Medicine

## 2019-07-08 DIAGNOSIS — Z1231 Encounter for screening mammogram for malignant neoplasm of breast: Secondary | ICD-10-CM

## 2019-07-29 ENCOUNTER — Encounter (INDEPENDENT_AMBULATORY_CARE_PROVIDER_SITE_OTHER): Payer: Self-pay | Admitting: Vascular Surgery

## 2019-07-29 ENCOUNTER — Ambulatory Visit (INDEPENDENT_AMBULATORY_CARE_PROVIDER_SITE_OTHER): Payer: Medicare Other

## 2019-07-29 ENCOUNTER — Other Ambulatory Visit: Payer: Self-pay

## 2019-07-29 ENCOUNTER — Ambulatory Visit (INDEPENDENT_AMBULATORY_CARE_PROVIDER_SITE_OTHER): Payer: Medicare Other | Admitting: Vascular Surgery

## 2019-07-29 ENCOUNTER — Other Ambulatory Visit (INDEPENDENT_AMBULATORY_CARE_PROVIDER_SITE_OTHER): Payer: Self-pay | Admitting: Vascular Surgery

## 2019-07-29 VITALS — BP 130/63 | HR 76 | Resp 12 | Ht 60.0 in | Wt 119.0 lb

## 2019-07-29 DIAGNOSIS — I6523 Occlusion and stenosis of bilateral carotid arteries: Secondary | ICD-10-CM

## 2019-07-29 DIAGNOSIS — E039 Hypothyroidism, unspecified: Secondary | ICD-10-CM

## 2019-07-29 DIAGNOSIS — E7800 Pure hypercholesterolemia, unspecified: Secondary | ICD-10-CM | POA: Diagnosis not present

## 2019-07-29 DIAGNOSIS — K219 Gastro-esophageal reflux disease without esophagitis: Secondary | ICD-10-CM | POA: Diagnosis not present

## 2019-07-29 NOTE — Progress Notes (Signed)
MRN : [id number]  Lauren White is a 73 y.o. (1946/07/24) female who presents with chief complaint of  Chief Complaint  Patient presents with  . Follow-up  .  History of Present Illness:   The patient is seen for follow up evaluation of carotid stenosis. The carotid stenosis followed by ultrasound.   The patient denies amaurosis fugax. There is no recent history of TIA symptoms or focal motor deficits. There is no prior documented CVA.  The patient is taking enteric-coated aspirin 81 mg daily.  There is no history of migraine headaches. There is no history of seizures.  The patient has a history of coronary artery disease, no recent episodes of angina or shortness of breath. The patient denies PAD or claudication symptoms. There is a history of hyperlipidemia which is being treated with a statin.   Carotid Duplex done today shows 40-59% stenosis bilaterally.  No change compared to last study.  Current Meds  Medication Sig  . aspirin EC 81 MG tablet Take by mouth.  Marland Kitchen. atorvastatin (LIPITOR) 20 MG tablet   . Calcium Carbonate-Vitamin D 600-400 MG-UNIT tablet Take by mouth.  . citalopram (CELEXA) 20 MG tablet Take 20 mg by mouth daily.  . lansoprazole (PREVACID) 15 MG capsule Take by mouth.  . levothyroxine (SYNTHROID, LEVOTHROID) 75 MCG tablet Take by mouth.  . Magnesium Oxide 250 MG TABS Take by mouth.  . Melatonin 3 MG TABS Take 3 mg by mouth 3 (three) times a week.  . Multiple Vitamin (MULTI-VITAMINS) TABS Take by mouth.  . naproxen sodium (ANAPROX) 220 MG tablet Take by mouth.  . Omega-3 Fatty Acids (FISH OIL PO) Take by mouth.    Past Medical History:  Diagnosis Date  . Hyperlipidemia   . Hypothyroidism   . Personal history of tobacco use, presenting hazards to health 08/24/2015    Past Surgical History:  Procedure Laterality Date  . ABDOMINAL HYSTERECTOMY    . APPENDECTOMY    . CARDIAC ELECTROPHYSIOLOGY MAPPING AND ABLATION      Social History  Social History   Tobacco Use  . Smoking status: Former Smoker    Packs/day: 1.00    Years: 40.00    Pack years: 40.00    Types: Cigarettes  . Smokeless tobacco: Never Used  . Tobacco comment: quit 2006  Substance Use Topics  . Alcohol use: No  . Drug use: No    Family History Family History  Problem Relation Age of Onset  . Breast cancer Neg Hx     Allergies  Allergen Reactions  . Diphenhydramine Hcl Swelling    Increases pressure in the eyes due to glaucoma  . Etodolac Other (See Comments)    Chest pain  . Omeprazole Other (See Comments)    Chest pain  . Tramadol Other (See Comments)    Difficulty breathing  . Nickel Rash  . Povidone-Iodine Rash     REVIEW OF SYSTEMS (Negative unless checked)  Constitutional: [] Weight loss  [] Fever  [] Chills Cardiac: [] Chest pain   [] Chest pressure   [] Palpitations   [] Shortness of breath when laying flat   [] Shortness of breath with exertion. Vascular:  [] Pain in legs with walking   [] Pain in legs at rest  [] History of DVT   [] Phlebitis   [] Swelling in legs   [] Varicose veins   [] Non-healing ulcers Pulmonary:   [] Uses home oxygen   [] Productive cough   [] Hemoptysis   [] Wheeze  [] COPD   [] Asthma Neurologic:  [] Dizziness   [] Seizures   []   History of stroke   [] History of TIA  [] Aphasia   [] Vissual changes   [] Weakness or numbness in arm   [] Weakness or numbness in leg Musculoskeletal:   [] Joint swelling   [] Joint pain   [] Low back pain Hematologic:  [] Easy bruising  [] Easy bleeding   [] Hypercoagulable state   [] Anemic Gastrointestinal:  [] Diarrhea   [] Vomiting  [] Gastroesophageal reflux/heartburn   [] Difficulty swallowing. Genitourinary:  [] Chronic kidney disease   [] Difficult urination  [] Frequent urination   [] Blood in urine Skin:  [] Rashes   [] Ulcers  Psychological:  [] History of anxiety   []  History of major depression.  Physical Examination  Vitals:   07/29/19 1037  BP: 130/63  Pulse: 76  Resp: 12  Weight: 119 lb (54 kg)   Height: 5' (1.524 m)   Body mass index is 23.24 kg/m. Gen: WD/WN, NAD Head: [place]/AT, No temporalis wasting.  Ear/Nose/Throat: Hearing grossly intact, nares w/o erythema or drainage Eyes: PER, EOMI, sclera nonicteric.  Neck: Supple, no large masses.   Pulmonary:  Good air movement, no audible wheezing bilaterally, no use of accessory muscles.  Cardiac: RRR, no JVD Vascular: bilateral carotid bruits Vessel Right Left  Radial Palpable Palpable  PT Palpable Palpable  DP Palpable Palpable  Gastrointestinal: Non-distended. No guarding/no peritoneal signs.  Musculoskeletal: M/S 5/5 throughout.  No deformity or atrophy.  Neurologic: CN 2-12 intact. Symmetrical.  Speech is fluent. Motor exam as listed above. Psychiatric: Judgment intact, Mood & affect appropriate for pt's clinical situation. Dermatologic: No rashes or ulcers noted.  No changes consistent with cellulitis. Lymph : No lichenification or skin changes of chronic lymphedema.  CBC Lab Results  Component Value Date   WBC 9.9 05/28/2013   HGB 13.0 05/28/2013   HCT 37.9 05/28/2013   MCV 87 05/28/2013   PLT 198 05/28/2013    BMET    Component Value Date/Time   NA 141 01/11/2013 1558   K 3.9 01/11/2013 1558   CL 108 (H) 01/11/2013 1558   CO2 25 01/11/2013 1558   GLUCOSE 108 (H) 01/11/2013 1558   BUN 15 01/11/2013 1558   CREATININE 0.70 01/11/2013 1558   CALCIUM 8.9 01/11/2013 1558   GFRNONAA >60 01/11/2013 1558   GFRAA >60 01/11/2013 1558   CrCl cannot be calculated (Patient's most recent lab result is older than the maximum 21 days allowed.).  COAG No results found for: INR, PROTIME  Radiology Mm 3d Screen Breast Bilateral  Result Date: 07/08/2019 CLINICAL DATA:  Screening. EXAM: DIGITAL SCREENING BILATERAL MAMMOGRAM WITH TOMO AND CAD COMPARISON:  Previous exam(s). ACR Breast Density Category b: There are scattered areas of fibroglandular density. FINDINGS: There are no findings suspicious for malignancy. Images were  processed with CAD. IMPRESSION: No mammographic evidence of malignancy. A result letter of this screening mammogram will be mailed directly to the patient. RECOMMENDATION: Screening mammogram in one year. (Code:SM-B-01Y) BI-RADS CATEGORY  1: Negative. Electronically Signed   By: Fidela Salisbury M.D.   On: 07/08/2019 10:31      Assessment/Plan 1. Bilateral carotid artery stenosis Recommend:  Given the patient's asymptomatic subcritical stenosis no further invasive testing or surgery at this time.  Duplex ultrasound shows 40-59% stenosis bilaterally.  Continue antiplatelet therapy as prescribed Continue management of CAD, HTN and Hyperlipidemia Healthy heart diet,  encouraged exercise at least 4 times per week Follow up in 12 months with duplex ultrasound and physical exam based on the 50% stenosis of the bilateral carotid artery  - VAS US CAROTID; Future  2. Gastroesophageal reflux  disease without esophagitis Continue antihypertensive medications as already ordered, these medications have been reviewed and there are no changes at this time.  Avoidence of caffeine and alcohol  Moderate elevation of the head of the bed   3. Pure hypercholesterolemia Continue statin as ordered and reviewed, no changes at this time   4. Hypothyroidism, unspecified type Continue Synthroid as already ordered, these medications have been reviewed and there are no changes at this time.   Levora DredgeGregory Reyes Fifield, MD  07/29/2019 11:08 AM

## 2019-10-20 ENCOUNTER — Telehealth: Payer: Self-pay | Admitting: *Deleted

## 2019-10-20 DIAGNOSIS — Z122 Encounter for screening for malignant neoplasm of respiratory organs: Secondary | ICD-10-CM

## 2019-10-20 DIAGNOSIS — Z87891 Personal history of nicotine dependence: Secondary | ICD-10-CM

## 2019-10-20 NOTE — Telephone Encounter (Signed)
Patient has been notified that annual lung cancer screening low dose CT scan is due currently or will be in near future. Confirmed that patient is within the age range of 55-77, and asymptomatic, (no signs or symptoms of lung cancer). Patient denies illness that would prevent curative treatment for lung cancer if found. Verified smoking history, (former, quit 2006, 40 pack year). The shared decision making visit was done 08/25/15. Patient is agreeable for CT scan being scheduled.

## 2019-11-15 ENCOUNTER — Ambulatory Visit
Admission: RE | Admit: 2019-11-15 | Discharge: 2019-11-15 | Disposition: A | Discharge status: Home / Self Care | Payer: Medicare Other | Source: Ambulatory Visit | Attending: Oncology | Admitting: Oncology

## 2019-11-15 ENCOUNTER — Other Ambulatory Visit: Payer: Self-pay

## 2019-11-15 DIAGNOSIS — Z122 Encounter for screening for malignant neoplasm of respiratory organs: Secondary | ICD-10-CM | POA: Diagnosis present

## 2019-11-15 DIAGNOSIS — Z87891 Personal history of nicotine dependence: Secondary | ICD-10-CM | POA: Diagnosis present

## 2019-11-17 ENCOUNTER — Encounter: Payer: Self-pay | Admitting: *Deleted

## 2020-07-04 ENCOUNTER — Other Ambulatory Visit: Payer: Self-pay | Admitting: Internal Medicine

## 2020-07-04 DIAGNOSIS — Z1231 Encounter for screening mammogram for malignant neoplasm of breast: Secondary | ICD-10-CM

## 2020-07-12 ENCOUNTER — Ambulatory Visit
Admission: RE | Admit: 2020-07-12 | Discharge: 2020-07-12 | Disposition: A | Discharge status: Home / Self Care | Payer: Medicare Other | Source: Ambulatory Visit | Attending: Internal Medicine | Admitting: Internal Medicine

## 2020-07-12 DIAGNOSIS — Z1231 Encounter for screening mammogram for malignant neoplasm of breast: Secondary | ICD-10-CM | POA: Insufficient documentation

## 2020-07-31 ENCOUNTER — Ambulatory Visit (INDEPENDENT_AMBULATORY_CARE_PROVIDER_SITE_OTHER): Payer: Medicare Other | Admitting: Vascular Surgery

## 2020-07-31 ENCOUNTER — Encounter (INDEPENDENT_AMBULATORY_CARE_PROVIDER_SITE_OTHER): Payer: Medicare Other

## 2020-08-02 NOTE — Progress Notes (Signed)
MRN : [id number]  Lauren White is a 74 y.o. (06/27/1946) female who presents with chief complaint of No chief complaint on file. Marland Kitchen  History of Present Illness:   The patient is seen for follow up evaluation of carotid stenosis. The carotid stenosis followed by ultrasound.   The patient denies amaurosis fugax. There is no recent history of TIA symptoms or focal motor deficits. There is no prior documented CVA.  The patient is taking enteric-coated aspirin 81 mg daily.  There is no history of migraine headaches. There is no history of seizures.  The patient has a history of coronary artery disease, no recent episodes of angina or shortness of breath. The patient denies PAD or claudication symptoms. There is a history of hyperlipidemia which is being treated with a statin.   Carotid Duplex done today shows<40% stenosis bilaterally. Apparent decrease compared to last study.  No outpatient medications have been marked as taking for the 08/03/20 encounter (Appointment) with Gilda Crease, Latina Craver, MD.    Past Medical History:  Diagnosis Date  . Hyperlipidemia   . Hypothyroidism   . Personal history of tobacco use, presenting hazards to health 08/24/2015    Past Surgical History:  Procedure Laterality Date  . ABDOMINAL HYSTERECTOMY    . APPENDECTOMY    . CARDIAC ELECTROPHYSIOLOGY MAPPING AND ABLATION      Social History Social History   Tobacco Use  . Smoking status: Former Smoker    Packs/day: 1.00    Years: 40.00    Pack years: 40.00    Types: Cigarettes  . Smokeless tobacco: Never Used  . Tobacco comment: quit 2006  Substance Use Topics  . Alcohol use: No  . Drug use: No    Family History Family History  Problem Relation Age of Onset  . Breast cancer Neg Hx     Allergies  Allergen Reactions  . Diphenhydramine Hcl Swelling    Increases pressure in the eyes due to glaucoma  . Etodolac Other (See Comments)    Chest pain  . Omeprazole Other (See  Comments)    Chest pain  . Tramadol Other (See Comments)    Difficulty breathing  . Nickel Rash  . Povidone-Iodine Rash     REVIEW OF SYSTEMS (Negative unless checked)  Constitutional: [] Weight loss  [] Fever  [] Chills Cardiac: [] Chest pain   [] Chest pressure   [] Palpitations   [] Shortness of breath when laying flat   [] Shortness of breath with exertion. Vascular:  [] Pain in legs with walking   [] Pain in legs at rest  [] History of DVT   [] Phlebitis   [] Swelling in legs   [] Varicose veins   [] Non-healing ulcers Pulmonary:   [] Uses home oxygen   [] Productive cough   [] Hemoptysis   [] Wheeze  [] COPD   [] Asthma Neurologic:  [] Dizziness   [] Seizures   [] History of stroke   [] History of TIA  [] Aphasia   [] Vissual changes   [] Weakness or numbness in arm   [] Weakness or numbness in leg Musculoskeletal:   [] Joint swelling   [] Joint pain   [] Low back pain Hematologic:  [] Easy bruising  [] Easy bleeding   [] Hypercoagulable state   [] Anemic Gastrointestinal:  [] Diarrhea   [] Vomiting  [] Gastroesophageal reflux/heartburn   [] Difficulty swallowing. Genitourinary:  [] Chronic kidney disease   [] Difficult urination  [] Frequent urination   [] Blood in urine Skin:  [] Rashes   [] Ulcers  Psychological:  [] History of anxiety   []  History of major depression.  Physical Examination  There were no vitals  filed for this visit. There is no height or weight on file to calculate BMI. Gen: WD/WN, NAD Head: [place]/AT, No temporalis wasting.  Ear/Nose/Throat: Hearing grossly intact, nares w/o erythema or drainage Eyes: PER, EOMI, sclera nonicteric.  Neck: Supple, no large masses.   Pulmonary:  Good air movement, no audible wheezing bilaterally, no use of accessory muscles.  Cardiac: RRR, no JVD Vascular: bilateral carotid bruits Vessel Right Left  Radial Palpable Palpable  Carotid Palpable Palpable  Gastrointestinal: Non-distended. No guarding/no peritoneal signs.  Musculoskeletal: M/S 5/5 throughout.  No deformity or  atrophy.  Neurologic: CN 2-12 intact. Symmetrical.  Speech is fluent. Motor exam as listed above. Psychiatric: Judgment intact, Mood & affect appropriate for pt's clinical situation. Dermatologic: No rashes or ulcers noted.  No changes consistent with cellulitis.   CBC Lab Results  Component Value Date   WBC 9.9 05/28/2013   HGB 13.0 05/28/2013   HCT 37.9 05/28/2013   MCV 87 05/28/2013   PLT 198 05/28/2013    BMET    Component Value Date/Time   NA 141 01/11/2013 1558   K 3.9 01/11/2013 1558   CL 108 (H) 01/11/2013 1558   CO2 25 01/11/2013 1558   GLUCOSE 108 (H) 01/11/2013 1558   BUN 15 01/11/2013 1558   CREATININE 0.70 01/11/2013 1558   CALCIUM 8.9 01/11/2013 1558   GFRNONAA >60 01/11/2013 1558   GFRAA >60 01/11/2013 1558   CrCl cannot be calculated (Patient's most recent lab result is older than the maximum 21 days allowed.).  COAG No results found for: INR, PROTIME  Radiology MM 3D SCREEN BREAST BILATERAL  Result Date: 07/14/2020 CLINICAL DATA:  Screening. EXAM: DIGITAL SCREENING BILATERAL MAMMOGRAM WITH TOMO AND CAD COMPARISON:  Previous exam(s). ACR Breast Density Category b: There are scattered areas of fibroglandular density. FINDINGS: There are no findings suspicious for malignancy. Images were processed with CAD. IMPRESSION: No mammographic evidence of malignancy. A result letter of this screening mammogram will be mailed directly to the patient. RECOMMENDATION: Screening mammogram in one year. (Code:SM-B-01Y) BI-RADS CATEGORY  1: Negative. Electronically Signed   By: Amie Portland M.D.   On: 07/14/2020 09:07     Assessment/Plan 1. Bilateral carotid artery stenosis Recommend:  Given the patient's asymptomatic subcritical stenosis no further invasive testing or surgery at this time.  Duplex ultrasound shows <40% stenosis bilaterally.  Continue antiplatelet therapy as prescribed Continue management of CAD, HTN and Hyperlipidemia Healthy heart diet,   encouraged exercise at least 4 times per week Follow up in 24 months with duplex ultrasound and physical exam   2. Pure hypercholesterolemia Continue statin as ordered and reviewed, no changes at this time   3. Gastroesophageal reflux disease without esophagitis Continue PPI as already ordered, this medication has been reviewed and there are no changes at this time.  Avoidence of caffeine and alcohol  Moderate elevation of the head of the bed   4. Hypothyroidism, unspecified type Continue hormone replacement as ordered and reviewed, no changes at this time    Levora Dredge, MD  08/02/2020 11:21 AM

## 2020-08-03 ENCOUNTER — Other Ambulatory Visit: Payer: Self-pay

## 2020-08-03 ENCOUNTER — Ambulatory Visit (INDEPENDENT_AMBULATORY_CARE_PROVIDER_SITE_OTHER): Payer: Medicare Other

## 2020-08-03 ENCOUNTER — Ambulatory Visit (INDEPENDENT_AMBULATORY_CARE_PROVIDER_SITE_OTHER): Payer: Medicare Other | Admitting: Vascular Surgery

## 2020-08-03 ENCOUNTER — Encounter (INDEPENDENT_AMBULATORY_CARE_PROVIDER_SITE_OTHER): Payer: Self-pay | Admitting: Vascular Surgery

## 2020-08-03 VITALS — BP 158/67 | HR 69 | Resp 16 | Wt 121.4 lb

## 2020-08-03 DIAGNOSIS — E7800 Pure hypercholesterolemia, unspecified: Secondary | ICD-10-CM

## 2020-08-03 DIAGNOSIS — K219 Gastro-esophageal reflux disease without esophagitis: Secondary | ICD-10-CM

## 2020-08-03 DIAGNOSIS — E039 Hypothyroidism, unspecified: Secondary | ICD-10-CM | POA: Diagnosis not present

## 2020-08-03 DIAGNOSIS — I6523 Occlusion and stenosis of bilateral carotid arteries: Secondary | ICD-10-CM | POA: Diagnosis not present

## 2020-08-03 DIAGNOSIS — E119 Type 2 diabetes mellitus without complications: Secondary | ICD-10-CM | POA: Insufficient documentation

## 2020-08-03 DIAGNOSIS — R091 Pleurisy: Secondary | ICD-10-CM | POA: Insufficient documentation

## 2020-11-08 ENCOUNTER — Other Ambulatory Visit: Payer: Self-pay | Admitting: *Deleted

## 2020-11-08 DIAGNOSIS — Z122 Encounter for screening for malignant neoplasm of respiratory organs: Secondary | ICD-10-CM

## 2020-11-08 DIAGNOSIS — Z87891 Personal history of nicotine dependence: Secondary | ICD-10-CM

## 2020-11-08 NOTE — Progress Notes (Signed)
Contacted and scheduled for annual lung screening scan. Patient is a former smoker, quit 15 years ago, 40 pack year history. Patient is aware that this will be the last lung screening scan due to quit date.

## 2020-11-17 ENCOUNTER — Other Ambulatory Visit: Payer: Self-pay

## 2020-11-17 ENCOUNTER — Ambulatory Visit
Admission: RE | Admit: 2020-11-17 | Discharge: 2020-11-17 | Disposition: A | Discharge status: Home / Self Care | Payer: Medicare Other | Source: Ambulatory Visit | Attending: Nurse Practitioner | Admitting: Nurse Practitioner

## 2020-11-17 DIAGNOSIS — Z122 Encounter for screening for malignant neoplasm of respiratory organs: Secondary | ICD-10-CM | POA: Diagnosis present

## 2020-11-17 DIAGNOSIS — Z87891 Personal history of nicotine dependence: Secondary | ICD-10-CM

## 2020-11-21 ENCOUNTER — Encounter: Payer: Self-pay | Admitting: *Deleted

## 2021-03-13 HISTORY — PX: EYE SURGERY: SHX253

## 2021-04-06 ENCOUNTER — Emergency Department: Payer: Medicare Other

## 2021-04-06 ENCOUNTER — Encounter: Payer: Self-pay | Admitting: *Deleted

## 2021-04-06 ENCOUNTER — Observation Stay
Admission: EM | Admit: 2021-04-06 | Discharge: 2021-04-07 | Disposition: A | Discharge status: Home / Self Care | Payer: Medicare Other | Attending: Emergency Medicine | Admitting: Emergency Medicine

## 2021-04-06 ENCOUNTER — Observation Stay: Payer: Medicare Other

## 2021-04-06 ENCOUNTER — Other Ambulatory Visit: Payer: Self-pay

## 2021-04-06 DIAGNOSIS — Z20822 Contact with and (suspected) exposure to covid-19: Secondary | ICD-10-CM | POA: Diagnosis not present

## 2021-04-06 DIAGNOSIS — E039 Hypothyroidism, unspecified: Secondary | ICD-10-CM | POA: Insufficient documentation

## 2021-04-06 DIAGNOSIS — N200 Calculus of kidney: Secondary | ICD-10-CM | POA: Diagnosis not present

## 2021-04-06 DIAGNOSIS — R310 Gross hematuria: Secondary | ICD-10-CM

## 2021-04-06 DIAGNOSIS — Z87891 Personal history of nicotine dependence: Secondary | ICD-10-CM | POA: Diagnosis not present

## 2021-04-06 DIAGNOSIS — N132 Hydronephrosis with renal and ureteral calculous obstruction: Principal | ICD-10-CM | POA: Insufficient documentation

## 2021-04-06 DIAGNOSIS — Z79899 Other long term (current) drug therapy: Secondary | ICD-10-CM | POA: Diagnosis not present

## 2021-04-06 DIAGNOSIS — E119 Type 2 diabetes mellitus without complications: Secondary | ICD-10-CM | POA: Diagnosis not present

## 2021-04-06 DIAGNOSIS — Z7982 Long term (current) use of aspirin: Secondary | ICD-10-CM | POA: Insufficient documentation

## 2021-04-06 DIAGNOSIS — R109 Unspecified abdominal pain: Secondary | ICD-10-CM

## 2021-04-06 DIAGNOSIS — N1339 Other hydronephrosis: Secondary | ICD-10-CM | POA: Diagnosis not present

## 2021-04-06 DIAGNOSIS — N1330 Unspecified hydronephrosis: Secondary | ICD-10-CM

## 2021-04-06 DIAGNOSIS — R319 Hematuria, unspecified: Secondary | ICD-10-CM | POA: Diagnosis present

## 2021-04-06 HISTORY — DX: Preglaucoma, unspecified, unspecified eye: H40.009

## 2021-04-06 LAB — CBC WITH DIFFERENTIAL/PLATELET
Abs Immature Granulocytes: 0.05 10*3/uL (ref 0.00–0.07)
Basophils Absolute: 0.1 10*3/uL (ref 0.0–0.1)
Basophils Relative: 1 %
Eosinophils Absolute: 0.5 10*3/uL (ref 0.0–0.5)
Eosinophils Relative: 4 %
HCT: 37.9 % (ref 36.0–46.0)
Hemoglobin: 12.6 g/dL (ref 12.0–15.0)
Immature Granulocytes: 1 %
Lymphocytes Relative: 20 %
Lymphs Abs: 2.1 10*3/uL (ref 0.7–4.0)
MCH: 28.7 pg (ref 26.0–34.0)
MCHC: 33.2 g/dL (ref 30.0–36.0)
MCV: 86.3 fL (ref 80.0–100.0)
Monocytes Absolute: 0.6 10*3/uL (ref 0.1–1.0)
Monocytes Relative: 6 %
Neutro Abs: 7.1 10*3/uL (ref 1.7–7.7)
Neutrophils Relative %: 68 %
Platelets: 211 10*3/uL (ref 150–400)
RBC: 4.39 MIL/uL (ref 3.87–5.11)
RDW: 13.5 % (ref 11.5–15.5)
WBC: 10.3 10*3/uL (ref 4.0–10.5)
nRBC: 0 % (ref 0.0–0.2)

## 2021-04-06 LAB — HEPATIC FUNCTION PANEL
ALT: 19 U/L (ref 0–44)
AST: 25 U/L (ref 15–41)
Albumin: 3.8 g/dL (ref 3.5–5.0)
Alkaline Phosphatase: 105 U/L (ref 38–126)
Bilirubin, Direct: <0.1 mg/dL (ref 0.0–0.2)
Total Bilirubin: 0.4 mg/dL (ref 0.3–1.2)
Total Protein: 6.6 g/dL (ref 6.5–8.1)

## 2021-04-06 LAB — URINALYSIS, COMPLETE (UACMP) WITH MICROSCOPIC
Bacteria, UA: NONE SEEN
RBC / HPF: >50 RBC/hpf — ABNORMAL HIGH (ref 0–5)
Specific Gravity, Urine: 1.017 (ref 1.005–1.030)
WBC, UA: >50 WBC/hpf — ABNORMAL HIGH (ref 0–5)

## 2021-04-06 LAB — BASIC METABOLIC PANEL
Anion gap: 8 (ref 5–15)
BUN: 22 mg/dL (ref 8–23)
CO2: 26 mmol/L (ref 22–32)
Calcium: 9.1 mg/dL (ref 8.9–10.3)
Chloride: 104 mmol/L (ref 98–111)
Creatinine, Ser: 0.68 mg/dL (ref 0.44–1.00)
GFR, Estimated: >60 mL/min (ref >60)
Glucose, Bld: 151 mg/dL — ABNORMAL HIGH (ref 70–99)
Potassium: 4 mmol/L (ref 3.5–5.1)
Sodium: 138 mmol/L (ref 135–145)

## 2021-04-06 LAB — RESP PANEL BY RT-PCR (FLU A&B, COVID) ARPGX2
Influenza A by PCR: NEGATIVE
Influenza B by PCR: NEGATIVE
SARS Coronavirus 2 by RT PCR: NEGATIVE

## 2021-04-06 MED ORDER — ATORVASTATIN CALCIUM 20 MG PO TABS
20.0000 mg | ORAL_TABLET | Freq: Every day | ORAL | Status: DC
  Administered 2021-04-06: 20 mg via ORAL
  Filled 2021-04-06: qty 1

## 2021-04-06 MED ORDER — ZOLPIDEM TARTRATE 5 MG PO TABS: 5.0000 mg | ORAL_TABLET | Freq: Every evening | ORAL | Status: DC | PRN

## 2021-04-06 MED ORDER — SODIUM CHLORIDE 0.9 % IV BOLUS
1000.0000 mL | Freq: Once | INTRAVENOUS | Status: AC
  Administered 2021-04-06: 1000 mL via INTRAVENOUS

## 2021-04-06 MED ORDER — IOHEXOL 300 MG/ML  SOLN
120.0000 mL | Freq: Once | INTRAMUSCULAR | Status: AC | PRN
  Administered 2021-04-06: 120 mL via INTRAVENOUS

## 2021-04-06 MED ORDER — SODIUM CHLORIDE 0.9 % IV SOLN: INTRAVENOUS | Status: DC

## 2021-04-06 MED ORDER — LEVOTHYROXINE SODIUM 50 MCG PO TABS
75.0000 ug | ORAL_TABLET | Freq: Every day | ORAL | Status: DC
  Administered 2021-04-07: 75 ug via ORAL
  Filled 2021-04-06 (×2): qty 1

## 2021-04-06 MED ORDER — MORPHINE SULFATE (PF) 2 MG/ML IV SOLN
2.0000 mg | INTRAVENOUS | Status: DC | PRN
Start: 2021-04-06 — End: 2021-04-07
  Administered 2021-04-06: 21:00:00 2 mg via INTRAVENOUS
  Filled 2021-04-06: qty 1

## 2021-04-06 MED ORDER — ACETAMINOPHEN 325 MG PO TABS: 650.0000 mg | ORAL_TABLET | ORAL | Status: DC | PRN

## 2021-04-06 MED ORDER — IOHEXOL 300 MG/ML  SOLN: 125.0000 mL | Freq: Once | INTRAMUSCULAR | Status: DC | PRN

## 2021-04-06 MED ORDER — ONDANSETRON HCL 4 MG/2ML IJ SOLN: 4.0000 mg | INTRAMUSCULAR | Status: DC | PRN

## 2021-04-06 MED ORDER — ONDANSETRON 4 MG PO TBDP
4.0000 mg | ORAL_TABLET | Freq: Once | ORAL | Status: AC
  Administered 2021-04-06: 4 mg via ORAL
  Filled 2021-04-06: qty 1

## 2021-04-06 MED ORDER — HYDROCODONE-ACETAMINOPHEN 5-325 MG PO TABS
1.0000 | ORAL_TABLET | Freq: Once | ORAL | Status: AC
  Administered 2021-04-06: 1 via ORAL
  Filled 2021-04-06: qty 1

## 2021-04-06 MED ORDER — MELATONIN 5 MG PO TABS
5.0000 mg | ORAL_TABLET | Freq: Every day | ORAL | Status: DC
  Filled 2021-04-06 (×2): qty 1

## 2021-04-06 MED ORDER — HYDROCODONE-ACETAMINOPHEN 5-325 MG PO TABS
1.0000 | ORAL_TABLET | ORAL | Status: DC | PRN
  Administered 2021-04-06: 2 via ORAL
  Filled 2021-04-06: qty 2

## 2021-04-06 MED ORDER — KETOROLAC TROMETHAMINE 30 MG/ML IJ SOLN
15.0000 mg | Freq: Once | INTRAMUSCULAR | Status: AC
  Administered 2021-04-06: 15 mg via INTRAVENOUS
  Filled 2021-04-06: qty 1

## 2021-04-06 MED ORDER — TAMSULOSIN HCL 0.4 MG PO CAPS
0.4000 mg | ORAL_CAPSULE | Freq: Once | ORAL | Status: AC
  Administered 2021-04-06: 0.4 mg via ORAL
  Filled 2021-04-06: qty 1

## 2021-04-06 MED ORDER — CITALOPRAM HYDROBROMIDE 20 MG PO TABS
40.0000 mg | ORAL_TABLET | Freq: Every day | ORAL | Status: DC
  Administered 2021-04-07: 10:00:00 40 mg via ORAL
  Filled 2021-04-06: qty 2

## 2021-04-06 NOTE — ED Notes (Signed)
Informed RN bed assigned [id number]

## 2021-04-06 NOTE — ED Notes (Signed)
Dr Alvester Morin in with pt

## 2021-04-06 NOTE — H&P (Addendum)
H&P  Chief Complaint: Gross hematuria  History of Present Illness: 75 year old female presented with acute right-sided flank pain.  She also had gross hematuria with clots.  This prompted her to come to the emergency department where she had a CT renal stone protocol.  This revealed a proximal left 10 mm calculus without hydronephrosis.  She was also found to have moderate right hydroureteronephrosis with high attenuation material in the renal pelvis and ureter consistent with hemorrhage.  She had bilateral nonobstructing stones as well.  Her pain has improved with pain medication.  Creatinine is normal at 0.68.  Hemoglobin normal at 12.6.  She continues to have some hematuria with clots.  She denies any fever, nausea, vomiting or voiding complaints other than the hematuria.  Past Medical History:  Diagnosis Date  . Borderline glaucoma   . Hyperlipidemia   . Hypothyroidism   . Personal history of tobacco use, presenting hazards to health 08/24/2015   Past Surgical History:  Procedure Laterality Date  . ABDOMINAL HYSTERECTOMY    . APPENDECTOMY    . CARDIAC ELECTROPHYSIOLOGY MAPPING AND ABLATION    . EYE SURGERY      Home Medications:  (Not in a hospital admission)  Allergies:  Allergies  Allergen Reactions  . Diphenhydramine Hcl Swelling    Increases pressure in the eyes due to glaucoma  . Etodolac Other (See Comments)    Chest pain  . Omeprazole Other (See Comments)    Chest pain  . Tramadol Other (See Comments)    Difficulty breathing  . Nickel Rash  . Povidone-Iodine Rash    Family History  Problem Relation Age of Onset  . Breast cancer Neg Hx    Social History:  reports that she has quit smoking. Her smoking use included cigarettes. She has a 40.00 pack-year smoking history. She has never used smokeless tobacco. She reports that she does not drink alcohol and does not use drugs.  ROS: A complete review of systems was performed.  All systems are negative except for  pertinent findings as noted. ROS   Physical Exam:  Vital signs in last 24 hours: Temp:  [97.8 F (36.6 C)] 97.8 F (36.6 C) (04/08 1325) Pulse Rate:  [67-80] 80 (04/08 1701) Resp:  [18] 18 (04/08 1701) BP: (150-181)/(73-76) 181/76 (04/08 1701) SpO2:  [98 %-100 %] 98 % (04/08 1701) Weight:  [54.4 kg] 54.4 kg (04/08 1328) General:  Alert and oriented, No acute distress HEENT: Normocephalic, atraumatic Neck: No JVD or lymphadenopathy Cardiovascular: Regular rate and rhythm Lungs: Regular rate and effort Abdomen: Soft, nontender, nondistended, no abdominal masses Back: No CVA tenderness Extremities: No edema Neurologic: Grossly intact  Laboratory Data:  Results for orders placed or performed during the hospital encounter of 04/06/21 (from the past 24 hour(s))  Urinalysis, Complete w Microscopic Urine, Clean Catch     Status: Abnormal   Collection Time: 04/06/21  1:51 PM  Result Value Ref Range   Color, Urine RED (A) YELLOW   APPearance CLOUDY (A) CLEAR   Specific Gravity, Urine 1.017 1.005 - 1.030   pH  5.0 - 8.0    TEST NOT REPORTED DUE TO COLOR INTERFERENCE OF URINE PIGMENT   Glucose, UA (A) NEGATIVE mg/dL    TEST NOT REPORTED DUE TO COLOR INTERFERENCE OF URINE PIGMENT   Hgb urine dipstick (A) NEGATIVE    TEST NOT REPORTED DUE TO COLOR INTERFERENCE OF URINE PIGMENT   Bilirubin Urine (A) NEGATIVE    TEST NOT REPORTED DUE TO COLOR INTERFERENCE  OF URINE PIGMENT   Ketones, ur (A) NEGATIVE mg/dL    TEST NOT REPORTED DUE TO COLOR INTERFERENCE OF URINE PIGMENT   Protein, ur (A) NEGATIVE mg/dL    TEST NOT REPORTED DUE TO COLOR INTERFERENCE OF URINE PIGMENT   Nitrite (A) NEGATIVE    TEST NOT REPORTED DUE TO COLOR INTERFERENCE OF URINE PIGMENT   Leukocytes,Ua (A) NEGATIVE    TEST NOT REPORTED DUE TO COLOR INTERFERENCE OF URINE PIGMENT   RBC / HPF >50 (H) 0 - 5 RBC/hpf   WBC, UA >50 (H) 0 - 5 WBC/hpf   Bacteria, UA NONE SEEN NONE SEEN   Squamous Epithelial / LPF 11-20 0 - 5   Basic metabolic panel     Status: Abnormal   Collection Time: 04/06/21  2:10 PM  Result Value Ref Range   Sodium 138 135 - 145 mmol/L   Potassium 4.0 3.5 - 5.1 mmol/L   Chloride 104 98 - 111 mmol/L   CO2 26 22 - 32 mmol/L   Glucose, Bld 151 (H) 70 - 99 mg/dL   BUN 22 8 - 23 mg/dL   Creatinine, Ser 0.35 0.44 - 1.00 mg/dL   Calcium 9.1 8.9 - 00.9 mg/dL   GFR, Estimated >38 >18 mL/min   Anion gap 8 5 - 15  CBC with Differential     Status: None   Collection Time: 04/06/21  2:10 PM  Result Value Ref Range   WBC 10.3 4.0 - 10.5 K/uL   RBC 4.39 3.87 - 5.11 MIL/uL   Hemoglobin 12.6 12.0 - 15.0 g/dL   HCT 29.9 37.1 - 69.6 %   MCV 86.3 80.0 - 100.0 fL   MCH 28.7 26.0 - 34.0 pg   MCHC 33.2 30.0 - 36.0 g/dL   RDW 78.9 38.1 - 01.7 %   Platelets 211 150 - 400 K/uL   nRBC 0.0 0.0 - 0.2 %   Neutrophils Relative % 68 %   Neutro Abs 7.1 1.7 - 7.7 K/uL   Lymphocytes Relative 20 %   Lymphs Abs 2.1 0.7 - 4.0 K/uL   Monocytes Relative 6 %   Monocytes Absolute 0.6 0.1 - 1.0 K/uL   Eosinophils Relative 4 %   Eosinophils Absolute 0.5 0.0 - 0.5 K/uL   Basophils Relative 1 %   Basophils Absolute 0.1 0.0 - 0.1 K/uL   Immature Granulocytes 1 %   Abs Immature Granulocytes 0.05 0.00 - 0.07 K/uL   No results found for this or any previous visit (from the past 240 hour(s)). Creatinine: Recent Labs    04/06/21 1410  CREATININE 0.68   CT scan personally reviewed and is detailed in history of present illness  Impression/Assessment:  Left ureteral calculus Right hydronephrosis secondary to hemorrhage, unknown cause  Plan:  Plan to admit for at least observation given that she has a left ureteral calculus as well as right-sided hydronephrosis.  I would like to continue to watch her labs to ensure stability.  She may potentially need bilateral ureteral stent, possible bilateral ureteroscopy, laser lithotripsy on the left.  I will keep her n.p.o. at midnight and recheck labs tomorrow.  Pain  medication as needed.  Start IV fluids.  Obtain CT hematuria protocol.  If her labs are stable tomorrow, I think it is reasonable to discharge her with close follow-up given that the left side appears nonobstructing and her renal function is normal and she is making urine.  Plan pending how she does overnight and labs in the morning.  Ray Church, III 04/06/2021, 5:54 PM

## 2021-04-06 NOTE — ED Provider Notes (Signed)
[facility] Emergency Department Provider Note ____________________________________________  Time seen: 1410  I have reviewed the triage vital signs and the nursing notes.  HISTORY  Chief Complaint  Hematuria  HPI Lauren White is a 75 y.o. female with the below medical history, who presents herself to the ED for evaluation of gross hematuria and right-sided flank pain.  Patient does give a history of kidney stones found incidentally on previous CT imaging of the chest.  She is not aware of any passage of stones in the past.   She reports onset of symptoms this morning.  She denies any fever, chills, sweats, urinary retention, pelvic pain, or syncope.  Past Medical History:  Diagnosis Date  . Borderline glaucoma   . Hyperlipidemia   . Hypothyroidism   . Personal history of tobacco use, presenting hazards to health 08/24/2015    Patient Active Problem List   Diagnosis Date Noted  . Diabetes mellitus type 2, uncomplicated (HCC) 08/03/2020  . Pleurisy 08/03/2020  . Paroxysmal atrial fibrillation (HCC) 06/02/2017  . Carotid stenosis 02/13/2017  . Pure hypercholesterolemia 02/13/2017  . GERD (gastroesophageal reflux disease) 02/13/2017  . Hypothyroidism 02/13/2017  . Lesion of liver 09/05/2015  . Personal history of tobacco use, presenting hazards to health 08/24/2015  . Personal history of nicotine dependence 08/24/2015  . Abnormal TSH 01/16/2015  . Chronic low back pain 01/16/2015  . Generalized anxiety disorder 01/16/2015  . Anemia 06/15/2013  . Gastric ulcer due to Helicobacter pylori 06/15/2013  . Hyperlipidemia 06/15/2013  . Palpitation 06/15/2013  . SVT (supraventricular tachycardia) (HCC) 06/15/2013    Past Surgical History:  Procedure Laterality Date  . ABDOMINAL HYSTERECTOMY    . APPENDECTOMY    . CARDIAC ELECTROPHYSIOLOGY MAPPING AND ABLATION    . EYE SURGERY      Prior to Admission medications   Medication Sig Start Date End  Date Taking? Authorizing Provider  aspirin EC 81 MG tablet Take by mouth.    [provider]  atorvastatin (LIPITOR) 20 MG tablet  01/11/17   [provider]  Calcium Carbonate-Vitamin D 600-400 MG-UNIT tablet Take by mouth.    [provider]  citalopram (CELEXA) 40 MG tablet Take 40 mg by mouth daily.     [provider]  lansoprazole (PREVACID) 15 MG capsule Take by mouth.    [provider]  levothyroxine (SYNTHROID, LEVOTHROID) 50 MCG tablet Take by mouth. 08/20/16 08/20/17  [provider]  levothyroxine (SYNTHROID, LEVOTHROID) 75 MCG tablet Take by mouth. 01/02/18   [provider]  Magnesium Oxide 250 MG TABS Take by mouth.    [provider]  Melatonin 3 MG TABS Take 3 mg by mouth 3 (three) times a week.    [provider]  Multiple Vitamin (MULTI-VITAMINS) TABS Take by mouth.    [provider]  naproxen sodium (ANAPROX) 220 MG tablet Take by mouth.    [provider]  Omega-3 Fatty Acids (FISH OIL PO) Take by mouth.    [provider]    Allergies Diphenhydramine hcl, Etodolac, Omeprazole, Tramadol, Nickel, and Povidone-iodine  Family History  Problem Relation Age of Onset  . Breast cancer Neg Hx     Social History Social History   Tobacco Use  . Smoking status: Former Smoker    Packs/day: 1.00    Years: 40.00    Pack years: 40.00    Types: Cigarettes  . Smokeless tobacco: Never Used  . Tobacco comment: quit 2006  Substance Use  Topics  . Alcohol use: No  . Drug use: No    Review of Systems  Constitutional: Negative for fever. Cardiovascular: Negative for chest pain. Respiratory: Negative for shortness of breath. Gastrointestinal: Negative for abdominal pain, vomiting and diarrhea. Genitourinary: Negative for dysuria.  Reports gross hematuria as above. Musculoskeletal: Negative for back pain. Skin: Negative for rash. Neurological: Negative for headaches,  focal weakness or numbness. ____________________________________________  PHYSICAL EXAM:  VITAL SIGNS: ED Triage Vitals  Enc Vitals Group     BP 04/06/21 1325 (!) 150/73     Pulse Rate 04/06/21 1325 67     Resp 04/06/21 1325 18     Temp 04/06/21 1325 97.8 F (36.6 C)     Temp Source 04/06/21 1325 Oral     SpO2 04/06/21 1325 100 %     Weight 04/06/21 1328 120 lb (54.4 kg)     Height 04/06/21 1328 5' (1.524 m)     Head Circumference --      Peak Flow --      Pain Score 04/06/21 1328 6     Pain Loc --      Pain Edu? --      Excl. in GC? --     Constitutional: Alert and oriented. Well appearing and in no distress. Head: Normocephalic and atraumatic. Eyes: Conjunctivae are normal. Normal extraocular movements Cardiovascular: Normal rate, regular rhythm. Normal distal pulses. Respiratory: Normal respiratory effort. No wheezes/rales/rhonchi. Gastrointestinal: Soft and nontender. No distention, rebound, guarding, or rigidity.  Normal active bowel sounds noted.  Mild right CVA tenderness elicited. Musculoskeletal: Nontender with normal range of motion in all extremities.  Neurologic:  Normal gait without ataxia. Normal speech and language. No gross focal neurologic deficits are appreciated. Skin:  Skin is warm, dry and intact. No rash noted. Psychiatric: Mood and affect are normal. Patient exhibits appropriate insight and judgment. ____________________________________________   LABS (pertinent positives/negatives) Labs Reviewed  URINALYSIS, COMPLETE (UACMP) WITH MICROSCOPIC - Abnormal; Notable for the following components:      Result Value   Color, Urine RED (*)    APPearance CLOUDY (*)    Glucose, UA   (*)    Value: TEST NOT REPORTED DUE TO COLOR INTERFERENCE OF URINE PIGMENT   Hgb urine dipstick   (*)    Value: TEST NOT REPORTED DUE TO COLOR INTERFERENCE OF URINE PIGMENT   Bilirubin Urine   (*)    Value: TEST NOT REPORTED DUE TO COLOR INTERFERENCE OF URINE PIGMENT    Ketones, ur   (*)    Value: TEST NOT REPORTED DUE TO COLOR INTERFERENCE OF URINE PIGMENT   Protein, ur   (*)    Value: TEST NOT REPORTED DUE TO COLOR INTERFERENCE OF URINE PIGMENT   Nitrite   (*)    Value: TEST NOT REPORTED DUE TO COLOR INTERFERENCE OF URINE PIGMENT   Leukocytes,Ua   (*)    Value: TEST NOT REPORTED DUE TO COLOR INTERFERENCE OF URINE PIGMENT   RBC / HPF >50 (*)    WBC, UA >50 (*)    All other components within normal limits  BASIC METABOLIC PANEL - Abnormal; Notable for the following components:   Glucose, Bld 151 (*)    All other components within normal limits  URINE CULTURE  RESP PANEL BY RT-PCR (FLU A&B, COVID) ARPGX2  CBC WITH DIFFERENTIAL/PLATELET  ____________________________________________   RADIOLOGY  CT Renal Stone Study  IMPRESSION: 1. 10 x 7 x 4 mm proximal left ureteral stone without substantial hydroureteronephrosis. 2.  Moderate right hydroureteronephrosis. High attenuation material is identified in the right intrarenal collecting system and renal pelvis as well as in the mid and distal right ureter. Imaging features are likely related to hemorrhage although urothelial neoplasm could have this appearance. Close follow-up recommended and follow-up hematuria protocol CT may prove helpful. 3. Bilateral nonobstructing nephrolithiasis. 4. Large hepatic cyst, slowly progressive since 08/04/2007, now measuring up to 11.0 cm. 5. Tiny hiatal hernia. 6. Emphysema (ICD10-J43.9).  I, Lissa Hoard, personally viewed and evaluated these images (plain radiographs) as part of my medical decision making, as well as reviewing the written report by the radiologist. ____________________________________________  PROCEDURES  NS 1000 ml bolus IVP Toradol 15 mg IVP Zofran 4 mg ODT Norco 5-325 mg PO Tamsulosin 0.4 mg PO  Procedures ____________________________________________  INITIAL IMPRESSION / ASSESSMENT AND PLAN / ED COURSE  DDX; kidney  stones, pyelonephritis, UTI, malignancy   Patient ED evaluation of gross hematuria and right flank pain concerning for kidney stone passage.  Patient presented in stable condition and was evaluated for symptoms.  She was treated in the ED with IV fluids and medications and reports improvement of her pain at this time.  She has passed gross clots, but no signs of any stones, during her course here.  She will be discharged with prescriptions for pain medicine and tamsulosin take as necessary.  She is also referred to urology for further evaluation of her gross hematuria.  She is been made aware of her CT image results.  She is also aware of a stable fatty cyst on the liver which is slow-growing.  Patient is aware of the plan for admission, and is agreeable after discussing her case with Dr. Modena Slater.    ----------------------------------------- 5:24 PM on 04/06/2021 ----------------------------------------- Dr. Modena Slater (urology) in ED to evaluate patient.  He recommends admission to his service for evaluation and management of her symptoms.  Patient was found to have a 10 mill stone on the left side which is without symptoms or hydronephrosis.  She does however, have multiple stones on the right side which is symptomatic with hydronephrosis.  Complicating her case is a large hepatic cyst, which may be causing some of her hydronephrosis.  LONDA MACKOWSKI was evaluated in Emergency Department on 04/06/2021 for the symptoms described in the history of present illness. She was evaluated in the context of the global COVID-19 pandemic, which necessitated consideration that the patient might be at risk for infection with the SARS-CoV-2 virus that causes COVID-19. Institutional protocols and algorithms that pertain to the evaluation of patients at risk for COVID-19 are in a state of rapid change based on information released by regulatory bodies including the CDC and federal and state organizations. These  policies and algorithms were followed during the patient's care in the ED.  I reviewed the patient's prescription history over the last 12 months in the multi-state controlled substances database(s) that includes [place], [place], [place], [place], [place], [place], [place], [place], [place], [place], [place], [place], [place], and [place].  Results were notable for no RX history. ____________________________________________  FINAL CLINICAL IMPRESSION(S) / ED DIAGNOSES  Final diagnoses:  Gross hematuria  Flank pain, acute  Hydronephrosis, unspecified hydronephrosis type  Renal calculi      Lissa Hoard, PA-C 04/06/21 1739    Minna Antis, MD 04/06/21 2025

## 2021-04-06 NOTE — ED Triage Notes (Signed)
Patient reports gross hematuria with pain in right flank and right side of back. Patient reports symptoms began this am.

## 2021-04-07 DIAGNOSIS — N1330 Unspecified hydronephrosis: Secondary | ICD-10-CM | POA: Diagnosis not present

## 2021-04-07 DIAGNOSIS — R319 Hematuria, unspecified: Secondary | ICD-10-CM

## 2021-04-07 DIAGNOSIS — N132 Hydronephrosis with renal and ureteral calculous obstruction: Secondary | ICD-10-CM | POA: Diagnosis not present

## 2021-04-07 DIAGNOSIS — N200 Calculus of kidney: Secondary | ICD-10-CM | POA: Diagnosis not present

## 2021-04-07 HISTORY — DX: Hematuria, unspecified: R31.9

## 2021-04-07 LAB — BASIC METABOLIC PANEL
Anion gap: 5 (ref 5–15)
BUN: 14 mg/dL (ref 8–23)
CO2: 29 mmol/L (ref 22–32)
Calcium: 8.8 mg/dL — ABNORMAL LOW (ref 8.9–10.3)
Chloride: 107 mmol/L (ref 98–111)
Creatinine, Ser: 0.66 mg/dL (ref 0.44–1.00)
GFR, Estimated: >60 mL/min (ref >60)
Glucose, Bld: 103 mg/dL — ABNORMAL HIGH (ref 70–99)
Potassium: 4.7 mmol/L (ref 3.5–5.1)
Sodium: 141 mmol/L (ref 135–145)

## 2021-04-07 LAB — CBC
HCT: 32.9 % — ABNORMAL LOW (ref 36.0–46.0)
Hemoglobin: 10.9 g/dL — ABNORMAL LOW (ref 12.0–15.0)
MCH: 29.3 pg (ref 26.0–34.0)
MCHC: 33.1 g/dL (ref 30.0–36.0)
MCV: 88.4 fL (ref 80.0–100.0)
Platelets: 175 10*3/uL (ref 150–400)
RBC: 3.72 MIL/uL — ABNORMAL LOW (ref 3.87–5.11)
RDW: 13.8 % (ref 11.5–15.5)
WBC: 10.9 10*3/uL — ABNORMAL HIGH (ref 4.0–10.5)
nRBC: 0 % (ref 0.0–0.2)

## 2021-04-07 LAB — HEMOGLOBIN AND HEMATOCRIT, BLOOD
HCT: 33.8 % — ABNORMAL LOW (ref 36.0–46.0)
Hemoglobin: 11.1 g/dL — ABNORMAL LOW (ref 12.0–15.0)

## 2021-04-07 NOTE — Discharge Summary (Signed)
Date of admission: 04/06/2021  Date of discharge: 04/07/2021  Admission diagnosis: Gross hematuria, right hydronephrosis, left ureteral stone  Discharge diagnosis: Gross hematuria, bilateral renal stones, non-obstructing left ureteral stone, possible right upper tract tumor  History and Physical: For full details, please see admission history and physical. Briefly, Lauren White is a 75 y.o. year old patient who presented with right-sided flank pain was found to have gross hematuria, left proximal ureteral stone and blood clots within her right renal pelvis.  She was admitted for observation to ensure that her kidney function remained stable. Marland Kitchen  Hospital Course: She presented with acute right-sided flank pain along with hematuria with clots.  CT scan 04/06/2021 revealed a nonobstructing proximal left ureteral stone measuring 8 mm as well as right hydronephrosis with clots in her right renal pelvis.  Subsequent CT hematuria protocol 04/07/2021 revealed bilateral renal calculi, nonobstructing 8 mm stone in the left proximal ureter as well as abnormal filling defects in the upper pole of the right kidney worrisome for right upper tract tumor.  Her pain has resolved.  Her kidney function has remained normal.  Her creatinine on 04/07/2021 is normal at 0.66.  Her hemoglobin is stable at 11.1.  She states that she is not passing any further clots.  She states that her urine is only slight pink tinge.  She is tolerating a regular diet.  She is appropriate to discharge home today with close follow-up to schedule an elective surgery to treat her left proximal ureteral stone as well as obtain biopsy of her right upper tract tumor.  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  We did discuss that after obtaining biopsy of this, she would likely require a right nephro-ureterectomy. I sent a message to the schedulers at [facility]  urology to arrange for close follow-up.  CT hematuria protocol 04/06/2021: CLINICAL DATA:  Acute right flank pain. Followup abnormal earlier noncontrast CT scan.  EXAM: CT ABDOMEN AND PELVIS WITHOUT AND WITH CONTRAST  TECHNIQUE: Multidetector CT imaging of the abdomen and pelvis was performed following the standard protocol before and following the bolus administration of intravenous contrast.  CONTRAST:  OMNIPAQUE IOHEXOL 300 MG/ML  SOLN  COMPARISON:  Earlier CT scan, same date.  FINDINGS: Lower chest: The lung bases are clear of an acute process. Mild emphysematous changes and pulmonary scarring again noted. Fairly marked thickening of the distal esophagus may be part of a hiatal hernia. Recommend correlation with any symptoms of dysphagia. Esophagram or endoscopy may be helpful for further evaluation.  Hepatobiliary: Again demonstrated is a 14 cm hepatic cyst along with a few other smaller cysts. A few thin septal calcifications but no worrisome findings. The portal and hepatic veins are patent. The gallbladder is unremarkable. No common bile duct dilatation.  Pancreas: No mass, inflammation or ductal dilatation.  Spleen: Normal size.  No focal lesions.  Adrenals/Urinary Tract: Adrenal glands are normal.  Bilateral renal calculi are again noted. Again demonstrated is a nonobstructing 8 mm calculus in the proximal left ureter.  No worrisome renal lesions are identified. On the precontrast study there was high attenuation material in the right collecting system and right ureter. This was likely hemorrhage/hematoma. However, on the delayed images there are irregular filling defects in the upper pole region of the right kidney adjacent to but not corresponding to the renal calculi. Findings worrisome for uroepithelial neoplasm. Recommend urology consultation. No ureteral tumor  and no bladder tumor. Small cystocele noted.  Stomach/Bowel: The stomach,  duodenum, small bowel and colon are unremarkable.  Vascular/Lymphatic: The aorta and branch vessels are patent. There are moderate to advanced atherosclerotic calcifications. The major venous structures are patent. No mesenteric or retroperitoneal mass or adenopathy.  Reproductive: Surgically absent.  Other: No pelvic mass or adenopathy. No free pelvic fluid collections. No inguinal mass or adenopathy. No abdominal wall hernia or subcutaneous lesions.  Musculoskeletal: No significant bony findings.  IMPRESSION: 1. Bilateral renal calculi and nonobstructing 8 mm calculus in the proximal left ureter. 2. Abnormal filling defects in the upper pole region of the right kidney adjacent to but not corresponding to the renal calculi. Findings worrisome for uroepithelial neoplasm. Recommend Urology consultation. 3. No ureteral or bladder tumor. 4. No abdominal/pelvic mass or adenopathy. 5. Fairly marked thickening of the distal esophagus may be part of a hiatal hernia. Recommend correlation with any symptoms of dysphagia. Esophagram may be helpful for further evaluation. 6. Stable hepatic cysts. 7. Aortic atherosclerosis.  Aortic Atherosclerosis (ICD10-I70.0).   Electronically Signed   By: Rudie Meyer M.D.   On: 04/06/2021 21:41  Laboratory values:  Recent Labs    04/06/21 1410 04/07/21 0542 04/07/21 1028  HGB 12.6 10.9* 11.1*  HCT 37.9 32.9* 33.8*   Recent Labs    04/06/21 1410 04/07/21 0542  CREATININE 0.68 0.66    Disposition: Home  Discharge instruction: The patient was instructed to be ambulatory but told to refrain from heavy lifting, strenuous activity, or driving.   Discharge medications:  Allergies as of 04/07/2021      Reactions   Diphenhydramine Hcl Swelling   Increases pressure in the eyes due to glaucoma   Etodolac Other (See Comments)   Chest pain   Omeprazole Other (See Comments)   Chest pain   Tramadol Other (See Comments)   Difficulty  breathing   Nickel Rash   Povidone-iodine Rash      Medication List    TAKE these medications   aspirin EC 81 MG tablet Take by mouth.   atorvastatin 20 MG tablet Commonly known as: LIPITOR   Calcium Carbonate-Vitamin D 600-400 MG-UNIT tablet Take by mouth.   citalopram 40 MG tablet Commonly known as: CELEXA Take 40 mg by mouth daily.   FISH OIL PO Take by mouth.   lansoprazole 15 MG capsule Commonly known as: PREVACID Take by mouth.   levothyroxine 50 MCG tablet Commonly known as: SYNTHROID Take by mouth.   levothyroxine 75 MCG tablet Commonly known as: SYNTHROID Take by mouth.   Magnesium Oxide 250 MG Tabs Take by mouth.   melatonin 3 MG Tabs tablet Take 3 mg by mouth 3 (three) times a week.   Multi-Vitamins Tabs Take by mouth.   naproxen sodium 220 MG tablet Commonly known as: ALEVE Take by mouth.       Followup:   Follow-up Information    Call Crista Elliot, MD.   Specialty: Urology Why: As needed Contact information: [address] [place] [address] [phone]        Schedule an appointment as soon as possible for a visit with [facility].   Specialty: Urology Contact information: [address], [address] [place] [address] [phone]              Matt R. Zacchary Pompei MD [facility]  Pager: [phone]

## 2021-04-07 NOTE — Discharge Instructions (Signed)
   What to call us about: You should call the office [phone]) if you develop fever > 101 or develop persistent vomiting, significant pain or bleeding. Activity:  You are encouraged to ambulate frequently (about every hour during waking hours) to help prevent blood clots from forming in your legs or lungs.

## 2021-04-09 ENCOUNTER — Encounter: Payer: Self-pay | Admitting: Urology

## 2021-04-09 ENCOUNTER — Ambulatory Visit (INDEPENDENT_AMBULATORY_CARE_PROVIDER_SITE_OTHER): Payer: Medicare Other | Admitting: Urology

## 2021-04-09 ENCOUNTER — Other Ambulatory Visit: Payer: Self-pay

## 2021-04-09 VITALS — BP 126/70 | HR 76 | Ht 60.0 in | Wt 120.0 lb

## 2021-04-09 DIAGNOSIS — N3001 Acute cystitis with hematuria: Secondary | ICD-10-CM | POA: Diagnosis not present

## 2021-04-09 DIAGNOSIS — R93429 Abnormal radiologic findings on diagnostic imaging of unspecified kidney: Secondary | ICD-10-CM

## 2021-04-09 DIAGNOSIS — N201 Calculus of ureter: Secondary | ICD-10-CM

## 2021-04-09 LAB — URINE CULTURE: Culture: >=100000 — AB

## 2021-04-09 MED ORDER — SULFAMETHOXAZOLE-TRIMETHOPRIM 800-160 MG PO TABS: 1.0000 | ORAL_TABLET | Freq: Two times a day (BID) | ORAL | 0 refills | Status: DC

## 2021-04-09 NOTE — Progress Notes (Signed)
04/09/2021 1:06 PM   Lauren White 1946-03-08 [id number]  Referring provider: Gracelyn Nurse, MD [address] [place],  [place] [address]  Chief Complaint  Patient presents with  . Other    HPI: 75 year old female who was admitted over the weekend for observation of a 10 mm ureteral stone as well as hemorrhage versus tumor right collecting system.  She reports that her presenting symptom in the ER was just blood in her urine.  She denied any urgency frequency, flank pain, fevers or chills.  Initially, noncontrast CT scan showed left proximal/mid ureteral calculus along with high density material felt to be related to hemorrhage in the right renal pelvis.  A follow-up CT urogram showed some filling defects not associated with stones within the right collecting system.  She denies any flank pain.  She was discharged after a short observation.  No urinary symptoms.  She does have a remote history of smoking, quit about 14 years ago but smoked a pack a day up until that point.  Urinalysis today is somewhat suspicious, greater than 30 white blood cells, 3-10 red blood cells, many bacteria.  She did in fact grow E. coli as well as group B strep in her urine culture from 04/06/2021.  She is not currently on antibiotic.   PMH: Past Medical History:  Diagnosis Date  . Borderline glaucoma   . Hyperlipidemia   . Hypothyroidism   . Personal history of tobacco use, presenting hazards to health 08/24/2015    Surgical History: Past Surgical History:  Procedure Laterality Date  . ABDOMINAL HYSTERECTOMY    . APPENDECTOMY    . CARDIAC ELECTROPHYSIOLOGY MAPPING AND ABLATION    . EYE SURGERY      Home Medications:  Allergies as of 04/09/2021      Reactions   Diphenhydramine Hcl Swelling   Increases pressure in the eyes due to glaucoma   Etodolac Other (See Comments)   Chest pain   Omeprazole Other (See Comments)   Chest pain   Tramadol Other (See Comments)    Difficulty breathing   Nickel Rash   Povidone-iodine Rash      Medication List       Accurate as of April 09, 2021  1:06 PM. If you have any questions, ask your nurse or doctor.        aspirin EC 81 MG tablet Take by mouth.   atorvastatin 20 MG tablet Commonly known as: LIPITOR   Calcium Carbonate-Vitamin D 600-400 MG-UNIT tablet Take by mouth.   citalopram 40 MG tablet Commonly known as: CELEXA Take 40 mg by mouth daily.   FISH OIL PO Take by mouth.   lansoprazole 15 MG capsule Commonly known as: PREVACID Take by mouth.   levothyroxine 50 MCG tablet Commonly known as: SYNTHROID Take by mouth.   levothyroxine 75 MCG tablet Commonly known as: SYNTHROID Take by mouth.   Magnesium Oxide 250 MG Tabs Take by mouth.   melatonin 3 MG Tabs tablet Take 3 mg by mouth 3 (three) times a week.   Multi-Vitamins Tabs Take by mouth.   naproxen sodium 220 MG tablet Commonly known as: ALEVE Take by mouth.       Allergies:  Allergies  Allergen Reactions  . Diphenhydramine Hcl Swelling    Increases pressure in the eyes due to glaucoma  . Etodolac Other (See Comments)    Chest pain  . Omeprazole Other (See Comments)    Chest pain  . Tramadol Other (See Comments)  Difficulty breathing  . Nickel Rash  . Povidone-Iodine Rash    Family History: Family History  Problem Relation Age of Onset  . Breast cancer Neg Hx     Social History:  reports that she has quit smoking. Her smoking use included cigarettes. She has a 40.00 pack-year smoking history. She has never used smokeless tobacco. She reports that she does not drink alcohol and does not use drugs.   Physical Exam: BP 126/70   Pulse 76   Ht 5' (1.524 m)   Wt 120 lb (54.4 kg)   BMI 23.44 kg/m   Constitutional:  Alert and oriented, No acute distress. HEENT: [place] AT, moist mucus membranes.  Trachea midline, no masses. Cardiovascular: No clubbing, cyanosis, or edema. Respiratory: Normal respiratory  effort, no increased work of breathing. Skin: No rashes, bruises or suspicious lesions. Neurologic: Grossly intact, no focal deficits, moving all 4 extremities. Psychiatric: Normal mood and affect.  Laboratory Data: Lab Results  Component Value Date   WBC 10.9 (H) 04/07/2021   HGB 11.1 (L) 04/07/2021   HCT 33.8 (L) 04/07/2021   MCV 88.4 04/07/2021   PLT 175 04/07/2021    Lab Results  Component Value Date   CREATININE 0.66 04/07/2021   Urinalysis as above  Pertinent Imaging:  CT HEMATURIA WORKUP  Narrative CLINICAL DATA:  Acute right flank pain. Followup abnormal earlier noncontrast CT scan.  EXAM: CT ABDOMEN AND PELVIS WITHOUT AND WITH CONTRAST  TECHNIQUE: Multidetector CT imaging of the abdomen and pelvis was performed following the standard protocol before and following the bolus administration of intravenous contrast.  CONTRAST:  OMNIPAQUE IOHEXOL 300 MG/ML  SOLN  COMPARISON:  Earlier CT scan, same date.  FINDINGS: Lower chest: The lung bases are clear of an acute process. Mild emphysematous changes and pulmonary scarring again noted. Fairly marked thickening of the distal esophagus may be part of a hiatal hernia. Recommend correlation with any symptoms of dysphagia. Esophagram or endoscopy may be helpful for further evaluation.  Hepatobiliary: Again demonstrated is a 14 cm hepatic cyst along with a few other smaller cysts. A few thin septal calcifications but no worrisome findings. The portal and hepatic veins are patent. The gallbladder is unremarkable. No common bile duct dilatation.  Pancreas: No mass, inflammation or ductal dilatation.  Spleen: Normal size.  No focal lesions.  Adrenals/Urinary Tract: Adrenal glands are normal.  Bilateral renal calculi are again noted. Again demonstrated is a nonobstructing 8 mm calculus in the proximal left ureter.  No worrisome renal lesions are identified. On the precontrast study there was high  attenuation material in the right collecting system and right ureter. This was likely hemorrhage/hematoma. However, on the delayed images there are irregular filling defects in the upper pole region of the right kidney adjacent to but not corresponding to the renal calculi. Findings worrisome for uroepithelial neoplasm. Recommend urology consultation. No ureteral tumor and no bladder tumor. Small cystocele noted.  Stomach/Bowel: The stomach, duodenum, small bowel and colon are unremarkable.  Vascular/Lymphatic: The aorta and branch vessels are patent. There are moderate to advanced atherosclerotic calcifications. The major venous structures are patent. No mesenteric or retroperitoneal mass or adenopathy.  Reproductive: Surgically absent.  Other: No pelvic mass or adenopathy. No free pelvic fluid collections. No inguinal mass or adenopathy. No abdominal wall hernia or subcutaneous lesions.  Musculoskeletal: No significant bony findings.  IMPRESSION: 1. Bilateral renal calculi and nonobstructing 8 mm calculus in the proximal left ureter. 2. Abnormal filling defects in the upper pole  region of the right kidney adjacent to but not corresponding to the renal calculi. Findings worrisome for uroepithelial neoplasm. Recommend Urology consultation. 3. No ureteral or bladder tumor. 4. No abdominal/pelvic mass or adenopathy. 5. Fairly marked thickening of the distal esophagus may be part of a hiatal hernia. Recommend correlation with any symptoms of dysphagia. Esophagram may be helpful for further evaluation. 6. Stable hepatic cysts. 7. Aortic atherosclerosis.  Aortic Atherosclerosis (ICD10-I70.0).   Electronically Signed By: Rudie Meyer M.D. On: 04/06/2021 21:41  CT urogram images were personally reviewed today.  Agree with radiologic interpretation.  There is some mild left renal pelvic fullness associated with the stone as well as the filling defects appreciable within the  right collecting system primarily in the upper pole.  Assessment & Plan:    1. Left ureteral stone Mildly obstructing 10 mm left proximal ureteral calculus, unlikely to pass spontaneously based on size  We discussed various treatment options for urolithiasis including observation with or without medical expulsive therapy, shockwave lithotripsy (SWL), ureteroscopy and laser lithotripsy with stent placement, and percutaneous nephrolithotomy.   We discussed that management is based on stone size, location, density, patient co-morbidities, and patient preference.    Stones <28mm in size have a >80% spontaneous passage rate. Data surrounding the use of tamsulosin for medical expulsive therapy is controversial, but meta analyses suggests it is most efficacious for distal stones between 5-66mm in size. Possible side effects include dizziness/lightheadedness, and retrograde ejaculation.   SWL has a lower stone free rate in a single procedure, but also a lower complication rate compared to ureteroscopy and avoids a stent and associated stent related symptoms. Possible complications include renal hematoma, steinstrasse, and need for additional treatment. We discussed the role of his increased skin to stone distance can lead to decreased efficacy with shockwave lithotripsy.   Ureteroscopy with laser lithotripsy and stent placement has a higher stone free rate than SWL in a single procedure, however increased complication rate including possible infection, ureteral injury, bleeding, and stent related morbidity. Common stent related symptoms include dysuria, urgency/frequency, and flank pain.   After an extensive discussion of the risks and benefits of the above treatment options, the patient would like to proceed with  Ureteroscopy.  - Urinalysis, Complete - CULTURE, URINE COMPREHENSIVE - Cytology - Non PAP;  2. Kidney filling defect Findings highly suspicious for upper tract TCC versus  clot  Recommended diagnostic ureteroscopy at the time of treatment of the contralateral stone.  Plan for biopsy versus laser ablation.  Will also plan for ureteral stent.  All questions answered.  Risk the same as with ureteroscopy.  3. Acute cystitis with hematuria Fairly asymptomatic other than gross hematuria, grew E. coli sensitive to Bactrim.  We will go ahead and treat in light of upcoming surgery, Bactrim DS twice daily prescribed.   Vanna Scotland, MD  [facility] [address], [address] [place], [place] [address] [phone]  I spent 41 total minutes on the day of the encounter including pre-visit review of the medical record, face-to-face time with the patient, and post visit ordering of labs/imaging/tests.

## 2021-04-09 NOTE — Addendum Note (Signed)
Addended by: Milas Kocher A on: 04/09/2021 04:24 PM   Modules accepted: Orders

## 2021-04-09 NOTE — H&P (View-Only) (Signed)
04/09/2021 1:06 PM   Lauren White 1946-03-08 [id number]  Referring provider: Gracelyn Nurse, MD [address] [place],  [place] [address]  Chief Complaint  Patient presents with  . Other    HPI: 75 year old female who was admitted over the weekend for observation of a 10 mm ureteral stone as well as hemorrhage versus tumor right collecting system.  She reports that her presenting symptom in the ER was just blood in her urine.  She denied any urgency frequency, flank pain, fevers or chills.  Initially, noncontrast CT scan showed left proximal/mid ureteral calculus along with high density material felt to be related to hemorrhage in the right renal pelvis.  A follow-up CT urogram showed some filling defects not associated with stones within the right collecting system.  She denies any flank pain.  She was discharged after a short observation.  No urinary symptoms.  She does have a remote history of smoking, quit about 14 years ago but smoked a pack a day up until that point.  Urinalysis today is somewhat suspicious, greater than 30 white blood cells, 3-10 red blood cells, many bacteria.  She did in fact grow E. coli as well as group B strep in her urine culture from 04/06/2021.  She is not currently on antibiotic.   PMH: Past Medical History:  Diagnosis Date  . Borderline glaucoma   . Hyperlipidemia   . Hypothyroidism   . Personal history of tobacco use, presenting hazards to health 08/24/2015    Surgical History: Past Surgical History:  Procedure Laterality Date  . ABDOMINAL HYSTERECTOMY    . APPENDECTOMY    . CARDIAC ELECTROPHYSIOLOGY MAPPING AND ABLATION    . EYE SURGERY      Home Medications:  Allergies as of 04/09/2021      Reactions   Diphenhydramine Hcl Swelling   Increases pressure in the eyes due to glaucoma   Etodolac Other (See Comments)   Chest pain   Omeprazole Other (See Comments)   Chest pain   Tramadol Other (See Comments)    Difficulty breathing   Nickel Rash   Povidone-iodine Rash      Medication List       Accurate as of April 09, 2021  1:06 PM. If you have any questions, ask your nurse or doctor.        aspirin EC 81 MG tablet Take by mouth.   atorvastatin 20 MG tablet Commonly known as: LIPITOR   Calcium Carbonate-Vitamin D 600-400 MG-UNIT tablet Take by mouth.   citalopram 40 MG tablet Commonly known as: CELEXA Take 40 mg by mouth daily.   FISH OIL PO Take by mouth.   lansoprazole 15 MG capsule Commonly known as: PREVACID Take by mouth.   levothyroxine 50 MCG tablet Commonly known as: SYNTHROID Take by mouth.   levothyroxine 75 MCG tablet Commonly known as: SYNTHROID Take by mouth.   Magnesium Oxide 250 MG Tabs Take by mouth.   melatonin 3 MG Tabs tablet Take 3 mg by mouth 3 (three) times a week.   Multi-Vitamins Tabs Take by mouth.   naproxen sodium 220 MG tablet Commonly known as: ALEVE Take by mouth.       Allergies:  Allergies  Allergen Reactions  . Diphenhydramine Hcl Swelling    Increases pressure in the eyes due to glaucoma  . Etodolac Other (See Comments)    Chest pain  . Omeprazole Other (See Comments)    Chest pain  . Tramadol Other (See Comments)  Difficulty breathing  . Nickel Rash  . Povidone-Iodine Rash    Family History: Family History  Problem Relation Age of Onset  . Breast cancer Neg Hx     Social History:  reports that she has quit smoking. Her smoking use included cigarettes. She has a 40.00 pack-year smoking history. She has never used smokeless tobacco. She reports that she does not drink alcohol and does not use drugs.   Physical Exam: BP 126/70   Pulse 76   Ht 5' (1.524 m)   Wt 120 lb (54.4 kg)   BMI 23.44 kg/m   Constitutional:  Alert and oriented, No acute distress. HEENT: [place] AT, moist mucus membranes.  Trachea midline, no masses. Cardiovascular: No clubbing, cyanosis, or edema. Respiratory: Normal respiratory  effort, no increased work of breathing. Skin: No rashes, bruises or suspicious lesions. Neurologic: Grossly intact, no focal deficits, moving all 4 extremities. Psychiatric: Normal mood and affect.  Laboratory Data: Lab Results  Component Value Date   WBC 10.9 (H) 04/07/2021   HGB 11.1 (L) 04/07/2021   HCT 33.8 (L) 04/07/2021   MCV 88.4 04/07/2021   PLT 175 04/07/2021    Lab Results  Component Value Date   CREATININE 0.66 04/07/2021   Urinalysis as above  Pertinent Imaging:  CT HEMATURIA WORKUP  Narrative CLINICAL DATA:  Acute right flank pain. Followup abnormal earlier noncontrast CT scan.  EXAM: CT ABDOMEN AND PELVIS WITHOUT AND WITH CONTRAST  TECHNIQUE: Multidetector CT imaging of the abdomen and pelvis was performed following the standard protocol before and following the bolus administration of intravenous contrast.  CONTRAST:  OMNIPAQUE IOHEXOL 300 MG/ML  SOLN  COMPARISON:  Earlier CT scan, same date.  FINDINGS: Lower chest: The lung bases are clear of an acute process. Mild emphysematous changes and pulmonary scarring again noted. Fairly marked thickening of the distal esophagus may be part of a hiatal hernia. Recommend correlation with any symptoms of dysphagia. Esophagram or endoscopy may be helpful for further evaluation.  Hepatobiliary: Again demonstrated is a 14 cm hepatic cyst along with a few other smaller cysts. A few thin septal calcifications but no worrisome findings. The portal and hepatic veins are patent. The gallbladder is unremarkable. No common bile duct dilatation.  Pancreas: No mass, inflammation or ductal dilatation.  Spleen: Normal size.  No focal lesions.  Adrenals/Urinary Tract: Adrenal glands are normal.  Bilateral renal calculi are again noted. Again demonstrated is a nonobstructing 8 mm calculus in the proximal left ureter.  No worrisome renal lesions are identified. On the precontrast study there was high  attenuation material in the right collecting system and right ureter. This was likely hemorrhage/hematoma. However, on the delayed images there are irregular filling defects in the upper pole region of the right kidney adjacent to but not corresponding to the renal calculi. Findings worrisome for uroepithelial neoplasm. Recommend urology consultation. No ureteral tumor and no bladder tumor. Small cystocele noted.  Stomach/Bowel: The stomach, duodenum, small bowel and colon are unremarkable.  Vascular/Lymphatic: The aorta and branch vessels are patent. There are moderate to advanced atherosclerotic calcifications. The major venous structures are patent. No mesenteric or retroperitoneal mass or adenopathy.  Reproductive: Surgically absent.  Other: No pelvic mass or adenopathy. No free pelvic fluid collections. No inguinal mass or adenopathy. No abdominal wall hernia or subcutaneous lesions.  Musculoskeletal: No significant bony findings.  IMPRESSION: 1. Bilateral renal calculi and nonobstructing 8 mm calculus in the proximal left ureter. 2. Abnormal filling defects in the upper pole  region of the right kidney adjacent to but not corresponding to the renal calculi. Findings worrisome for uroepithelial neoplasm. Recommend Urology consultation. 3. No ureteral or bladder tumor. 4. No abdominal/pelvic mass or adenopathy. 5. Fairly marked thickening of the distal esophagus may be part of a hiatal hernia. Recommend correlation with any symptoms of dysphagia. Esophagram may be helpful for further evaluation. 6. Stable hepatic cysts. 7. Aortic atherosclerosis.  Aortic Atherosclerosis (ICD10-I70.0).   Electronically Signed By: P.  Gallerani M.D. On: 04/06/2021 21:41  CT urogram images were personally reviewed today.  Agree with radiologic interpretation.  There is some mild left renal pelvic fullness associated with the stone as well as the filling defects appreciable within the  right collecting system primarily in the upper pole.  Assessment & Plan:    1. Left ureteral stone Mildly obstructing 10 mm left proximal ureteral calculus, unlikely to pass spontaneously based on size  We discussed various treatment options for urolithiasis including observation with or without medical expulsive therapy, shockwave lithotripsy (SWL), ureteroscopy and laser lithotripsy with stent placement, and percutaneous nephrolithotomy.   We discussed that management is based on stone size, location, density, patient co-morbidities, and patient preference.    Stones <5mm in size have a >80% spontaneous passage rate. Data surrounding the use of tamsulosin for medical expulsive therapy is controversial, but meta analyses suggests it is most efficacious for distal stones between 5-10mm in size. Possible side effects include dizziness/lightheadedness, and retrograde ejaculation.   SWL has a lower stone free rate in a single procedure, but also a lower complication rate compared to ureteroscopy and avoids a stent and associated stent related symptoms. Possible complications include renal hematoma, steinstrasse, and need for additional treatment. We discussed the role of his increased skin to stone distance can lead to decreased efficacy with shockwave lithotripsy.   Ureteroscopy with laser lithotripsy and stent placement has a higher stone free rate than SWL in a single procedure, however increased complication rate including possible infection, ureteral injury, bleeding, and stent related morbidity. Common stent related symptoms include dysuria, urgency/frequency, and flank pain.   After an extensive discussion of the risks and benefits of the above treatment options, the patient would like to proceed with  Ureteroscopy.  - Urinalysis, Complete - CULTURE, URINE COMPREHENSIVE - Cytology - Non PAP;  2. Kidney filling defect Findings highly suspicious for upper tract TCC versus  clot  Recommended diagnostic ureteroscopy at the time of treatment of the contralateral stone.  Plan for biopsy versus laser ablation.  Will also plan for ureteral stent.  All questions answered.  Risk the same as with ureteroscopy.  3. Acute cystitis with hematuria Fairly asymptomatic other than gross hematuria, grew E. coli sensitive to Bactrim.  We will go ahead and treat in light of upcoming surgery, Bactrim DS twice daily prescribed.   Lauren Decamp, MD  [facility] [address], [address] , [place] [address] [phone]  I spent 41 total minutes on the day of the encounter including pre-visit review of the medical record, face-to-face time with the patient, and post visit ordering of labs/imaging/tests.   

## 2021-04-09 NOTE — Patient Instructions (Signed)
Ureteroscopy Ureteroscopy is a procedure to check for and treat problems inside part of the urinary tract. In this procedure, a thin, flexible tube with a light at the end (ureteroscope) is used to look at the inside of the kidneys and the ureters. The ureters are the tubes that carry urine from the kidneys to the bladder. The ureteroscope is inserted into one or both of the ureters. You may need this procedure if you have frequent urinary tract infections (UTIs), blood in your urine, or a stone in one of your ureters. A ureteroscopy can be done:  To find the cause of urine blockage in a ureter and to evaluate other abnormalities inside the ureters or kidneys.  To remove stones.  To remove or treat growths of tissue (polyps), abnormal tissue, and some types of tumors.  To remove a tissue sample and check it for disease under a microscope (biopsy). Tell a health care provider about:  Any allergies you have.  All medicines you are taking, including vitamins, herbs, eye drops, creams, and over-the-counter medicines.  Any problems you or family members have had with anesthetic medicines.  Any blood disorders you have.  Any surgeries you have had.  Any medical conditions you have.  Whether you are pregnant or may be pregnant. What are the risks? Generally, this is a safe procedure. However, problems may occur, including:  Bleeding.  Infection.  Allergic reactions to medicines.  Scarring that narrows the ureter (stricture).  Creating a hole in the ureter (perforation). What happens before the procedure? Staying hydrated Follow instructions from your health care provider about hydration, which may include:  Up to 2 hours before the procedure - you may continue to drink clear liquids, such as water, clear fruit juice, black coffee, and plain tea.   Eating and drinking restrictions Follow instructions from your health care provider about eating and drinking, which may include:  8  hours before the procedure - stop eating heavy meals or foods, such as meat, fried foods, or fatty foods.  6 hours before the procedure - stop eating light meals or foods, such as toast or cereal.  6 hours before the procedure - stop drinking milk or drinks that contain milk.  2 hours before the procedure - stop drinking clear liquids. Medicines Ask your health care provider about:  Changing or stopping your regular medicines. This is especially important if you are taking diabetes medicines or blood thinners.  Taking medicines such as aspirin and ibuprofen. These medicines can thin your blood. Do not take these medicines unless your health care provider tells you to take them.  Taking over-the-counter medicines, vitamins, herbs, and supplements. General instructions  Do not use any products that contain nicotine or tobacco for at least 4 weeks before the procedure. These products include cigarettes, e-cigarettes, and chewing tobacco. If you need help quitting, ask your health care provider.  You may have a urine sample taken to check for infection.  Plan to have someone take you home from the hospital or clinic.  If you will be going home right after the procedure, plan to have someone with you for 24 hours.  Ask your health care provider what steps will be taken to help prevent infection. These may include: ? Washing skin with a germ-killing soap. ? Receiving antibiotic medicine. What happens during the procedure?  An IV will be inserted into one of your veins.  You will be given one or more of the following: ? A medicine to help   you relax (sedative). ? A medicine to make you fall asleep (general anesthetic). ? A medicine that is injected into your spine to numb the area below and slightly above the injection site (spinal anesthetic).  The part of your body that drains urine from your bladder (urethra) will be cleaned with a germ-killing solution.  The ureteroscope will be  passed through your urethra into your bladder.  A salt-water solution will be sent through the ureteroscope to fill your bladder. This will help the health care provider see the openings of your ureters more clearly.  The ureteroscope will be passed into your ureter. ? If a growth is found, a biopsy may be done. ? If a stone is found, it may be removed through the ureteroscope, or the stone may be broken up using a laser, shock waves, or electrical energy. ? In some cases, if the ureter is too small, a tube may be inserted that keeps the ureter open (ureteral stent). The stent may be left in place for 1 or 2 weeks to keep the ureter open, and then the ureteroscopy procedure will be done.  The scope will be removed, and your bladder will be emptied. The procedure may vary among health care providers and hospitals.   What can I expect after the procedure? After your procedure, it is common to have:  Your blood pressure, heart rate, breathing rate, and blood oxygen level monitored until you leave the hospital or clinic.  A burning sensation when you urinate. You may be asked to urinate.  Blood in your urine.  Mild discomfort in your bladder area or kidney area when urinating.  A need to urinate more often or urgently. Follow these instructions at home: Medicines  Take over-the-counter and prescription medicines only as told by your health care provider.  If you were prescribed an antibiotic medicine, take it as told by your health care provider. Do not stop taking the antibiotic even if you start to feel better. General instructions  If you were given a sedative during the procedure, it can affect you for several hours. Do not drive or operate machinery until your health care provider says that it is safe.  To relieve burning, take a warm bath or hold a warm washcloth over your groin.  Drink enough fluid to keep your urine pale yellow. ? Drink two 8-ounce (237 mL) glasses of water  every hour for the first 2 hours after you get home. ? Continue to drink water often at home.  You can eat what you normally do.  Keep all follow-up visits as told by your health care provider. This is important. ? If you had a ureteral stent placed, ask your health care provider when you need to return to have it removed.   Contact a health care provider if you have:  Chills or a fever.  Burning pain for longer than 24 hours after the procedure.  Blood in your urine for longer than 24 hours after the procedure. Get help right away if you have:  Large amounts of blood in your urine.  Blood clots in your urine.  Severe pain.  Chest pain or trouble breathing.  The feeling of a full bladder and you are unable to urinate. These symptoms may represent a serious problem that is an emergency. Do not wait to see if the symptoms will go away. Get medical help right away. Call your local emergency services (911 in the [place] Summary  Ureteroscopy is a procedure to   check for and treat problems inside part of the urinary tract.  In this procedure, a thin, flexible tube with a light at the end (ureteroscope) is used to look at the inside of the kidneys and the ureters.  You may need this procedure if you have frequent urinary tract infections (UTIs), blood in your urine, or a stone in a ureter. This information is not intended to replace advice given to you by your health care provider. Make sure you discuss any questions you have with your health care provider. Document Revised: 09/22/2019 Document Reviewed: 09/22/2019 Elsevier Patient Education  2021 [employer]  

## 2021-04-10 ENCOUNTER — Telehealth: Payer: Self-pay

## 2021-04-10 ENCOUNTER — Other Ambulatory Visit
Admission: RE | Admit: 2021-04-10 | Discharge: 2021-04-10 | Disposition: A | Discharge status: Home / Self Care | Payer: Medicare Other | Source: Ambulatory Visit | Attending: Urology | Admitting: Urology

## 2021-04-10 ENCOUNTER — Other Ambulatory Visit: Payer: Self-pay | Admitting: Urology

## 2021-04-10 DIAGNOSIS — Z20822 Contact with and (suspected) exposure to covid-19: Secondary | ICD-10-CM | POA: Insufficient documentation

## 2021-04-10 DIAGNOSIS — Z01818 Encounter for other preprocedural examination: Secondary | ICD-10-CM | POA: Insufficient documentation

## 2021-04-10 DIAGNOSIS — N201 Calculus of ureter: Secondary | ICD-10-CM

## 2021-04-10 LAB — URINALYSIS, COMPLETE
Bilirubin, UA: NEGATIVE
Glucose, UA: NEGATIVE
Ketones, UA: NEGATIVE
Nitrite, UA: NEGATIVE
Specific Gravity, UA: 1.01 (ref 1.005–1.030)
Urobilinogen, Ur: 0.2 mg/dL (ref 0.2–1.0)
pH, UA: 7 (ref 5.0–7.5)

## 2021-04-10 LAB — MICROSCOPIC EXAMINATION: WBC, UA: >30 /hpf — AB (ref 0–5)

## 2021-04-10 NOTE — Telephone Encounter (Signed)
Pt LVM on triage line stating that she is scheduled for surgery this coming Thursday with Dr. Apolinar Junes, when she awoke this morning she had an episode of gross hematuria and questions if this will change her upcoming plans for surgery.   Called pt informed her that we are aware of her gross hematuria, advised pt that this is normal with stone. Advised pt to push fluids and continue antibiotics. Pt gave verbal understanding.

## 2021-04-11 ENCOUNTER — Telehealth: Payer: Self-pay | Admitting: *Deleted

## 2021-04-11 ENCOUNTER — Encounter
Admission: RE | Admit: 2021-04-11 | Discharge: 2021-04-11 | Disposition: A | Discharge status: Home / Self Care | Payer: Medicare Other | Source: Ambulatory Visit | Attending: Urology | Admitting: Urology

## 2021-04-11 ENCOUNTER — Other Ambulatory Visit: Payer: Self-pay

## 2021-04-11 HISTORY — DX: Cardiac arrhythmia, unspecified: I49.9

## 2021-04-11 HISTORY — DX: Depression, unspecified: F32.A

## 2021-04-11 HISTORY — DX: Anxiety disorder, unspecified: F41.9

## 2021-04-11 HISTORY — DX: Personal history of urinary calculi: Z87.442

## 2021-04-11 HISTORY — DX: Prediabetes: R73.03

## 2021-04-11 HISTORY — DX: Unspecified osteoarthritis, unspecified site: M19.90

## 2021-04-11 LAB — SARS CORONAVIRUS 2 (TAT 6-24 HRS): SARS Coronavirus 2: NEGATIVE

## 2021-04-11 MED ORDER — CEPHALEXIN 500 MG PO CAPS: 500.0000 mg | ORAL_CAPSULE | Freq: Three times a day (TID) | ORAL | 0 refills | Status: DC

## 2021-04-11 NOTE — Telephone Encounter (Addendum)
Patient informed, voiced understanding. Sent to pharmacy as requested  ----- Message from Vanna Scotland, MD sent at 04/11/2021  8:19 AM EDT ----- Please start her on Keflex 500 mg 3 times a day for 5 days as a precaution, growing likely vaginal contaminant but in light of her surgery tomorrow, would like to take upmost precaution and go ahead and treat this bacteria.  Vanna Scotland, MD

## 2021-04-11 NOTE — Patient Instructions (Addendum)
INSTRUCTIONS FOR SURGERY     Your surgery is scheduled for:   Thursday, April 14TH     When you arrive for surgery, report to the FIRST FLOOR OF THE MEDICAL MALL. ONCE REGISTRATION     HAS COMPLETED THEIR PROCESS, YOU WILL GO TO THE SECOND FLOOR AND CHECK IN AT THE      SURGICAL DESK.      REMEMBER: Instructions that are not followed completely may result in serious medical risk,  up to and including death, or upon the discretion of your surgeon and anesthesiologist,            your surgery may need to be rescheduled.  __X__ 1. Do not eat food after midnight the night before your procedure.                    No gum, candy, lozenger, tic tacs, tums or hard candies.                  ABSOLUTELY NOTHING SOLID IN YOUR MOUTH AFTER MIDNIGHT                    You may drink unlimited clear liquids up to 2 hours before you are scheduled to arrive for surgery.                   Do not drink anything within those 2 hours unless you need to take medicine, then take the                   smallest amount you need.  Clear liquids include:  water, apple juice without pulp,                   any flavor Gatorade, Black coffee, black tea.  Sugar may be added but no dairy/ honey /lemon.                        Broth and jello is not considered a clear liquid.  __x__  2. On the morning of surgery, please brush your teeth with toothpaste and water. You may rinse with                  mouthwash if you wish but DO NOT SWALLOW TOOTHPASTE OR MOUTHWASH  __X___3. NO alcohol for 24 hours before or after surgery.  __x___ 4.  Do NOT smoke or use e-cigarettes for 24 HOURS PRIOR TO SURGERY.                      DO NOT Use any chewable tobacco products for at least 6 hours prior to surgery.  __x___ 5. If you start any new medication after this appointment and prior to surgery, please                   Bring it with you on the day of surgery.  ___x__ 6. Notify  your doctor if there is any change in your medical condition, such as fever,  infection, vomitting, diarrhea or any open sores.  __x___ 7.  USE ANTIBACTERIAL SOAP as instructed, the night before surgery and the day of surgery.                   Once you have washed with this soap, do NOT use any of the following: Powders, perfumes                    or lotions. Please do not wear make up, hairpins, clips or nail polish. You MAY wear deodorant.                   Men may shave their face and neck.  Women need to shave 48 hours prior to surgery.                   DO NOT wear ANY jewelry on the day of surgery. If there are rings that are too tight to                    remove easily, please address this prior to the surgery day. Piercings need to be removed.                                                                     NO METAL ON YOUR BODY.                    Do NOT bring any valuables.  If you came to Pre-Admit testing then you will not need license,                     insurance card or credit card.  If you will be staying overnight, please either leave your things in                     the car or have your family be responsible for these items.                     [facility] IS NOT RESPONSIBLE FOR BELONGINGS OR VALUABLES.  ___X__ 8. DO NOT wear contact lenses on surgery day.  You may not have dentures,                     Hearing aides, contacts or glasses in the operating room. These items can be                    Placed in the Recovery Room to receive immediately after surgery.  __x___ 9. IF YOU ARE SCHEDULED TO GO HOME ON THE SAME DAY, YOU MUST                   Have someone to drive you home and to stay with you  for the first 24 hours.                    Have an arrangement prior to arriving on surgery day.  ___x__ 10. Take the following medications on the morning of surgery with a sip of water:  1. CELEXA                     2.  PREVACID                     3. SYNTHROID                     4. KEFLEX                     5. BACTRIM                     6.  __X__  12. STOP ALL ASPIRIN PRODUCTS NOW.                       THIS INCLUDES BC POWDERS / GOODIES POWDER  __x___ 13. STOP Anti-inflammatories as OF NOW                      This includes IBUPROFEN / MOTRIN / ADVIL / ALEVE/ NAPROXYN                    YOU MAY TAKE TYLENOL ANY TIME PRIOR TO SURGERY.  _____ 14.  Stop supplements until after surgery.                     This includes: CALCIUM-VITAMIN D // FISH OIL // MAGNESIUM // MULTIVITAMINS //                          MELATONIN    __X____18. Wear clean and comfortable clothing to the hospital.  PLEASE BRING PHONE NUMBERS OF YOUR CONTACT PEOPLE. IF YOU FIND YOU ARE USING PAIN MEDICATION AFTER SURGERY, PLEASE ADD A   STOOL SOFTENER TO YOUR REGIME.

## 2021-04-12 ENCOUNTER — Encounter
Admission: RE | Disposition: A | Discharge status: Home / Self Care | Payer: Self-pay | Source: Home / Self Care | Attending: Urology

## 2021-04-12 ENCOUNTER — Ambulatory Visit
Admission: RE | Admit: 2021-04-12 | Discharge: 2021-04-12 | Disposition: A | Discharge status: Home / Self Care | Payer: Medicare Other | Attending: Urology | Admitting: Urology

## 2021-04-12 ENCOUNTER — Encounter: Payer: Self-pay | Admitting: Urology

## 2021-04-12 ENCOUNTER — Ambulatory Visit: Payer: Medicare Other | Admitting: Anesthesiology

## 2021-04-12 ENCOUNTER — Ambulatory Visit: Payer: Medicare Other

## 2021-04-12 DIAGNOSIS — R319 Hematuria, unspecified: Secondary | ICD-10-CM | POA: Insufficient documentation

## 2021-04-12 DIAGNOSIS — R311 Benign essential microscopic hematuria: Secondary | ICD-10-CM

## 2021-04-12 DIAGNOSIS — Z7982 Long term (current) use of aspirin: Secondary | ICD-10-CM | POA: Diagnosis not present

## 2021-04-12 DIAGNOSIS — N201 Calculus of ureter: Secondary | ICD-10-CM | POA: Diagnosis present

## 2021-04-12 DIAGNOSIS — Z87891 Personal history of nicotine dependence: Secondary | ICD-10-CM | POA: Diagnosis not present

## 2021-04-12 DIAGNOSIS — Z90710 Acquired absence of both cervix and uterus: Secondary | ICD-10-CM | POA: Diagnosis not present

## 2021-04-12 DIAGNOSIS — Z79899 Other long term (current) drug therapy: Secondary | ICD-10-CM | POA: Diagnosis not present

## 2021-04-12 DIAGNOSIS — E785 Hyperlipidemia, unspecified: Secondary | ICD-10-CM | POA: Diagnosis not present

## 2021-04-12 DIAGNOSIS — E039 Hypothyroidism, unspecified: Secondary | ICD-10-CM | POA: Insufficient documentation

## 2021-04-12 DIAGNOSIS — Z888 Allergy status to other drugs, medicaments and biological substances status: Secondary | ICD-10-CM | POA: Diagnosis not present

## 2021-04-12 DIAGNOSIS — N1330 Unspecified hydronephrosis: Secondary | ICD-10-CM | POA: Diagnosis not present

## 2021-04-12 HISTORY — PX: URETEROSCOPY: SHX842

## 2021-04-12 HISTORY — PX: CYSTOSCOPY W/ RETROGRADES: SHX1426

## 2021-04-12 HISTORY — PX: CYSTOSCOPY/URETEROSCOPY/HOLMIUM LASER/STENT PLACEMENT: SHX6546

## 2021-04-12 LAB — GLUCOSE, CAPILLARY: Glucose-Capillary: 107 mg/dL — ABNORMAL HIGH (ref 70–99)

## 2021-04-12 LAB — CULTURE, URINE COMPREHENSIVE

## 2021-04-12 SURGERY — CYSTOSCOPY/URETEROSCOPY/HOLMIUM LASER/STENT PLACEMENT
Anesthesia: General | Site: Ureter | Laterality: Right

## 2021-04-12 MED ORDER — FENTANYL CITRATE (PF) 100 MCG/2ML IJ SOLN
INTRAMUSCULAR | Status: AC
  Filled 2021-04-12: qty 2

## 2021-04-12 MED ORDER — FENTANYL CITRATE (PF) 100 MCG/2ML IJ SOLN
INTRAMUSCULAR | Status: DC | PRN
  Administered 2021-04-12 (×2): 25 ug via INTRAVENOUS
  Administered 2021-04-12: 50 ug via INTRAVENOUS

## 2021-04-12 MED ORDER — ONDANSETRON HCL 4 MG/2ML IJ SOLN
INTRAMUSCULAR | Status: DC | PRN
  Administered 2021-04-12: 4 mg via INTRAVENOUS

## 2021-04-12 MED ORDER — ACETAMINOPHEN 160 MG/5ML PO SOLN
325.0000 mg | ORAL | Status: DC | PRN
  Filled 2021-04-12: qty 20.3

## 2021-04-12 MED ORDER — CHLORHEXIDINE GLUCONATE 0.12 % MT SOLN
15.0000 mL | Freq: Once | OROMUCOSAL | Status: AC
  Administered 2021-04-12: 15 mL via OROMUCOSAL

## 2021-04-12 MED ORDER — ORAL CARE MOUTH RINSE: 15.0000 mL | Freq: Once | OROMUCOSAL | Status: AC

## 2021-04-12 MED ORDER — PROPOFOL 10 MG/ML IV BOLUS
INTRAVENOUS | Status: DC | PRN
  Administered 2021-04-12: 100 mg via INTRAVENOUS

## 2021-04-12 MED ORDER — CEFAZOLIN SODIUM-DEXTROSE 2-4 GM/100ML-% IV SOLN
2.0000 g | INTRAVENOUS | Status: AC
  Administered 2021-04-12: 2 g via INTRAVENOUS

## 2021-04-12 MED ORDER — SUGAMMADEX SODIUM 200 MG/2ML IV SOLN
INTRAVENOUS | Status: DC | PRN
  Administered 2021-04-12: 200 mg via INTRAVENOUS

## 2021-04-12 MED ORDER — DEXAMETHASONE SODIUM PHOSPHATE 10 MG/ML IJ SOLN
INTRAMUSCULAR | Status: DC | PRN
  Administered 2021-04-12: 10 mg via INTRAVENOUS

## 2021-04-12 MED ORDER — SEVOFLURANE IN SOLN
RESPIRATORY_TRACT | Status: AC
  Filled 2021-04-12: qty 250

## 2021-04-12 MED ORDER — ACETAMINOPHEN 325 MG PO TABS
325.0000 mg | ORAL_TABLET | ORAL | Status: DC | PRN
Start: 2021-04-12 — End: 2021-04-12

## 2021-04-12 MED ORDER — FENTANYL CITRATE (PF) 100 MCG/2ML IJ SOLN: 25.0000 ug | INTRAMUSCULAR | Status: DC | PRN

## 2021-04-12 MED ORDER — HYDROCODONE-ACETAMINOPHEN 5-325 MG PO TABS: 1.0000 | ORAL_TABLET | Freq: Four times a day (QID) | ORAL | 0 refills | Status: DC | PRN

## 2021-04-12 MED ORDER — ONDANSETRON HCL 4 MG/2ML IJ SOLN: 4.0000 mg | Freq: Once | INTRAMUSCULAR | Status: DC | PRN

## 2021-04-12 MED ORDER — SUCCINYLCHOLINE CHLORIDE 20 MG/ML IJ SOLN
INTRAMUSCULAR | Status: DC | PRN
  Administered 2021-04-12: 100 mg via INTRAVENOUS

## 2021-04-12 MED ORDER — CHLORHEXIDINE GLUCONATE 0.12 % MT SOLN
OROMUCOSAL | Status: AC
  Filled 2021-04-12: qty 15

## 2021-04-12 MED ORDER — OXYBUTYNIN CHLORIDE 5 MG PO TABS: 5.0000 mg | ORAL_TABLET | Freq: Three times a day (TID) | ORAL | 0 refills | Status: DC | PRN

## 2021-04-12 MED ORDER — LIDOCAINE HCL (CARDIAC) PF 100 MG/5ML IV SOSY
PREFILLED_SYRINGE | INTRAVENOUS | Status: DC | PRN
  Administered 2021-04-12: 80 mg via INTRAVENOUS

## 2021-04-12 MED ORDER — ROCURONIUM BROMIDE 100 MG/10ML IV SOLN
INTRAVENOUS | Status: DC | PRN
  Administered 2021-04-12: 40 mg via INTRAVENOUS
  Administered 2021-04-12: 10 mg via INTRAVENOUS

## 2021-04-12 MED ORDER — ONDANSETRON HCL 4 MG/2ML IJ SOLN: 4.0000 mg | Freq: Once | INTRAMUSCULAR | Status: AC

## 2021-04-12 MED ORDER — ONDANSETRON HCL 4 MG/2ML IJ SOLN
INTRAMUSCULAR | Status: AC
  Administered 2021-04-12: 4 mg via INTRAVENOUS
  Filled 2021-04-12: qty 2

## 2021-04-12 MED ORDER — HYDROCODONE-ACETAMINOPHEN 7.5-325 MG PO TABS
1.0000 | ORAL_TABLET | Freq: Once | ORAL | Status: DC | PRN
Start: 2021-04-12 — End: 2021-04-12

## 2021-04-12 MED ORDER — CEFAZOLIN SODIUM-DEXTROSE 2-4 GM/100ML-% IV SOLN
INTRAVENOUS | Status: AC
  Filled 2021-04-12: qty 100

## 2021-04-12 MED ORDER — IOHEXOL 180 MG/ML  SOLN
INTRAMUSCULAR | Status: DC | PRN
  Administered 2021-04-12: 40 mL

## 2021-04-12 MED ORDER — DROPERIDOL 2.5 MG/ML IJ SOLN
0.6250 mg | Freq: Once | INTRAMUSCULAR | Status: DC | PRN
  Filled 2021-04-12: qty 2

## 2021-04-12 MED ORDER — LACTATED RINGERS IV SOLN: INTRAVENOUS | Status: DC | PRN

## 2021-04-12 MED ORDER — SODIUM CHLORIDE 0.9 % IV SOLN: INTRAVENOUS | Status: DC

## 2021-04-12 SURGICAL SUPPLY — 31 items
BAG DRAIN CYSTO-URO LG1000N (MISCELLANEOUS) ×6 IMPLANT
BASKET ZERO TIP 1.9FR (BASKET) ×3 IMPLANT
BSKT STON RTRVL ZERO TP 1.9FR (BASKET) ×5
CATH URET FLEX-TIP 2 LUMEN 10F (CATHETERS) IMPLANT
CATH URETL 5X70 OPEN END (CATHETERS) ×6 IMPLANT
CNTNR SPEC 2.5X3XGRAD LEK (MISCELLANEOUS)
CONT SPEC 4OZ STER OR WHT (MISCELLANEOUS)
CONT SPEC 4OZ STRL OR WHT (MISCELLANEOUS)
CONTAINER SPEC 2.5X3XGRAD LEK (MISCELLANEOUS) IMPLANT
DRAPE UTILITY 15X26 TOWEL STRL (DRAPES) ×6 IMPLANT
GLOVE SURG ENC MOIS LTX SZ6.5 (GLOVE) ×6 IMPLANT
GOWN STRL REUS W/ TWL LRG LVL3 (GOWN DISPOSABLE) ×10 IMPLANT
GOWN STRL REUS W/TWL LRG LVL3 (GOWN DISPOSABLE) ×12
GUIDEWIRE GREEN .038 145CM (MISCELLANEOUS) IMPLANT
GUIDEWIRE STR DUAL SENSOR (WIRE) ×6 IMPLANT
INFUSOR MANOMETER BAG 3000ML (MISCELLANEOUS) ×6 IMPLANT
IV NS IRRIG 3000ML ARTHROMATIC (IV SOLUTION) ×6 IMPLANT
KIT TURNOVER CYSTO (KITS) ×6 IMPLANT
PACK CYSTO AR (MISCELLANEOUS) ×6 IMPLANT
SET CYSTO W/LG BORE CLAMP LF (SET/KITS/TRAYS/PACK) ×6 IMPLANT
SHEATH URETERAL 12FRX35CM (MISCELLANEOUS) IMPLANT
STENT URET 6FRX22 CONTOUR (STENTS) ×3 IMPLANT
STENT URET 6FRX24 CONTOUR (STENTS) IMPLANT
STENT URET 6FRX26 CONTOUR (STENTS) IMPLANT
SURGILUBE 2OZ TUBE FLIPTOP (MISCELLANEOUS) ×6 IMPLANT
SYR 10ML LL (SYRINGE) ×6 IMPLANT
SYR 30ML LL (SYRINGE) ×6 IMPLANT
TRACTIP FLEXIVA PULSE ID 200 (Laser) ×6 IMPLANT
WATER STERILE IRR 1000ML POUR (IV SOLUTION) ×6 IMPLANT
WATER STERILE IRR 3000ML UROMA (IV SOLUTION) ×6 IMPLANT
WIRE AMPLATZ SSTIFF .035X260CM (WIRE) ×3 IMPLANT

## 2021-04-12 NOTE — Anesthesia Preprocedure Evaluation (Signed)
Anesthesia Evaluation  Patient identified by MRN, date of birth, ID band Patient awake    Reviewed: Allergy & Precautions, H&P , NPO status , reviewed documented beta blocker date and time   Airway Mallampati: II  TM Distance: >3 FB Neck ROM: full    Dental  (+) Upper Dentures, Lower Dentures   Pulmonary former smoker,    Pulmonary exam normal        Cardiovascular Normal cardiovascular exam+ dysrhythmias      Neuro/Psych PSYCHIATRIC DISORDERS Anxiety Depression    GI/Hepatic PUD, GERD  Medicated and Controlled,  Endo/Other  diabetesHypothyroidism   Renal/GU      Musculoskeletal  (+) Arthritis ,   Abdominal   Peds  Hematology  (+) Blood dyscrasia, anemia ,   Anesthesia Other Findings Past Medical History: No date: Anxiety No date: Arthritis 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 No date: Depression No date: Dysrhythmia     Comment:  HISTORY OF SVT, performed a cardiac ablation 04/07/2021: Hematuria No date: History of kidney stones No date: Hyperlipidemia No date: Hypothyroidism 08/24/2015: Personal history of tobacco use, presenting hazards to  health No date: Pre-diabetes Past Surgical History: No date: ABDOMINAL HYSTERECTOMY No date: APPENDECTOMY No date: CARDIAC ELECTROPHYSIOLOGY MAPPING AND ABLATION 03/13/2021: EYE SURGERY; Left     Comment:  cataract and vitrectomy BMI    Body Mass Index: 23.44 kg/m     Reproductive/Obstetrics                             Anesthesia Physical Anesthesia Plan  ASA: III  Anesthesia Plan: General   Post-op Pain Management:    Induction: Intravenous  PONV Risk Score and Plan: Ondansetron and Treatment may vary due to age or medical condition  Airway Management Planned: Oral ETT  Additional Equipment:   Intra-op Plan:   Post-operative Plan: Extubation in OR  Informed Consent: I have reviewed the patients History and Physical, chart, labs  and discussed the procedure including the risks, benefits and alternatives for the proposed anesthesia with the patient or authorized representative who has indicated his/her understanding and acceptance.     Dental Advisory Given  Plan Discussed with: CRNA  Anesthesia Plan Comments:         Anesthesia Quick Evaluation

## 2021-04-12 NOTE — Interval H&P Note (Signed)
History and Physical Interval Note:  04/12/2021 10:03 AM  Lauren White  has presented today for surgery, with the diagnosis of Bengin neoplasm of pelvis.  The various methods of treatment have been discussed with the patient and family. After consideration of risks, benefits and other options for treatment, the patient has consented to  Procedure(s): CYSTOSCOPY/URETEROSCOPY/HOLMIUM LASER/STENT PLACEMENT (Bilateral) CYSTOSCOPY WITH HOLMIUM LASER LITHOTRIPSY (Left) Possible Renal Pelvic Biopsy with laser ablation (Right) as a surgical intervention.  The patient's history has been reviewed, patient examined, no change in status, stable for surgery.  I have reviewed the patient's chart and labs.  Questions were answered to the patient's satisfaction.    RRR CTAB  Currently on abx for UTI  Vanna Scotland

## 2021-04-12 NOTE — Discharge Instructions (Signed)
You have a ureteral stent in place.  This is a tube that extends from your kidney to your bladder.  This may cause urinary bleeding, burning with urination, and urinary frequency.  Please call our office or present to the ED if you develop fevers >101 or pain which is not able to be controlled with oral pain medications.  You may be given either Flomax and/ or ditropan to help with bladder spasms and stent pain in addition to pain medications.   ° °[facility] °[address], [address] °[place], [place] [address] °[phone] ° ° ° °AMBULATORY SURGERY  °DISCHARGE INSTRUCTIONS ° ° °1) The drugs that you were given will stay in your system until tomorrow so for the next 24 hours you should not: ° °A) Drive an automobile °B) Make any legal decisions °C) Drink any alcoholic beverage ° ° °2) You may resume regular meals tomorrow.  Today it is better to start with liquids and gradually work up to solid foods. ° °You may eat anything you prefer, but it is better to start with liquids, then soup and crackers, and gradually work up to solid foods. ° ° °3) Please notify your doctor immediately if you have any unusual bleeding, trouble breathing, redness and pain at the surgery site, drainage, fever, or pain not relieved by medication. ° ° ° °4) Additional Instructions: ° ° ° ° ° ° ° °Please contact your physician with any problems or Same Day Surgery at [phone], Monday through Friday 6 am to 4 pm, or [facility] Main number at [phone]. °

## 2021-04-12 NOTE — Anesthesia Postprocedure Evaluation (Signed)
Anesthesia Post Note  Patient: Lauren White  Procedure(s) Performed: CYSTOSCOPY/URETEROSCOPY/HOLMIUM LASER/STENT PLACEMENT (Left Ureter) URETEROSCOPY (Right Ureter) CYSTOSCOPY WITH RETROGRADE PYELOGRAM (Bilateral Ureter)  Patient location during evaluation: PACU Anesthesia Type: General Level of consciousness: awake and alert Pain management: pain level controlled Vital Signs Assessment: post-procedure vital signs reviewed and stable Respiratory status: spontaneous breathing, nonlabored ventilation and respiratory function stable Cardiovascular status: blood pressure returned to baseline and stable Postop Assessment: no apparent nausea or vomiting Anesthetic complications: no   No complications documented.   Last Vitals:  Vitals:   04/12/21 1219 04/12/21 1235  BP: (!) 159/68 (!) 162/72  Pulse: 73 73  Resp: 15 16  Temp: 36.6 C (!) 36.1 C  SpO2: 94% 91%    Last Pain:  Vitals:   04/12/21 1235  TempSrc: Temporal  PainSc: 3                  Shun Pletz Garry Heater

## 2021-04-12 NOTE — Anesthesia Procedure Notes (Signed)
Procedure Name: Intubation Date/Time: 04/12/2021 10:33 AM Performed by: Nelda Marseille, CRNA Pre-anesthesia Checklist: Patient identified, Patient being monitored, Timeout performed, Emergency Drugs available and Suction available Patient Re-evaluated:Patient Re-evaluated prior to induction Oxygen Delivery Method: Circle system utilized Preoxygenation: Pre-oxygenation with 100% oxygen Induction Type: IV induction Ventilation: Mask ventilation without difficulty Laryngoscope Size: Mac, 3 and McGraph Grade View: Grade I Tube type: Oral Tube size: 7.0 mm Number of attempts: 1 Airway Equipment and Method: Stylet Placement Confirmation: ETT inserted through vocal cords under direct vision,  positive ETCO2 and breath sounds checked- equal and bilateral Secured at: 21 cm Tube secured with: Tape Dental Injury: Teeth and Oropharynx as per pre-operative assessment

## 2021-04-12 NOTE — Op Note (Signed)
Date of procedure: 04/12/21  Preoperative diagnosis:  1. Left proximal ureteral calculus 2. Possible right upper tract filling defect 3. Hematuria  Postoperative diagnosis:  1. Left proximal ureteral calculus 2. Normal left upper tract, no pathology 3. Hematuria  Procedure: 1. Cystoscopy 2. Bilateral ureteroscopy 3. Bilateral retrograde pyelogram 4. Left laser lithotripsy 5. Left ureteral stent placement 6. Interpretation of fluoroscopy less than 30 minutes  Surgeon: Vanna Scotland, MD  Anesthesia: General  Complications: None  Intraoperative findings: 1 cm left proximal ureteral calculus in unchanged position from previous CT.  Completely obliterated with ureteral stent placed.  Diagnostic ureteroscopy on the right negative for any upper tract pathology.  Bladder findings consistent with recent cystitis.  EBL: Minimal  Specimens: None  Drains: 6 x 22 French double-J ureteral stent on left, no tether  Indication: Lauren White is a 75 y.o. 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  After reviewing the management options for treatment, she elected to proceed with the above surgical procedure(s). We have discussed the potential benefits and risks of the procedure, side effects of the proposed treatment, the likelihood of the patient achieving the goals of the procedure, and any potential problems that might occur during the procedure or recuperation. Informed consent has been obtained.  Description of procedure:  The patient was taken to the operating room and general anesthesia was induced.  The patient was placed in the dorsal lithotomy position, prepped and draped in the usual sterile fashion, and preoperative antibiotics were administered. A preoperative time-out was performed.   A 21 French cystoscope was advanced per urethra into the bladder.  The bladder was  carefully inspected.  There are some very subtle erythema with changes consistent with cystitis cystica but no papillary changes or any other concerning findings concerning for underlying malignancy.  No obvious tumors or ulcerations.  Attention was first turned to the left ureteral orifice.  This was cannulated and a 5 [place] open-ended ureteral catheter just within the UO.  A gentle retrograde pyelogram was performed which showed a filling defect in the left proximal ureter at the location of the known stone.  There was some hydronephrosis just proximal to this.  A sensor was then placed up to the level of the kidney.  This was snapped in place.  A 4.5 semirigid ureteroscope was advanced all the way up to the proximal ureter which went quite easily up to the level of the stone.  And then brought in a 242 m laser fiber and using dusting settings of 0.3 J and 40 Hz, I dusted the stone.  Unfortunately, portion of the stone retropulsed up into the renal pelvis.  Once entire ureter was clear, I advanced a Super Stiff wire as a working wire through the scope up to the level of the kidney.  The semirigid ureteroscope was then removed.  A single channel 7 [place] digital flexible ureteroscope was then advanced over the Super Stiff wire up to the level of the kidney which went quite easily and was followed up fluoroscopically.  The working wire was removed leaving the safety wire in place alongside of the scope.  Several fragments of the stone were encountered in the mid upper and lower poles all of which were completely obliterated into fragments that were approximately the tip of the laser fiber in size.  There was one larger fragment in the lower pole which had difficulty fragmenting  and I used a 1.9 French tipless nitinol basket to relocate the stone into an upper pole where it was obliterated.  A final retrograde pyelogram was performed injecting contrast through the scope which showed no contrast extravasation and  created roadmap.  Ensure that each naviculars was cleared of all stone burden and there is only a small amount of debris residual.  I then backed the scope down the length of the ureter.  It is unremarkable without additional stone burden.  Finally, I advanced a 6 x 22 French double-J ureteral stent on the left over the wire up to the level of the kidney.  Upon removing the wire, there was a full coil both within the renal pelvis as well as within the bladder.  Attention was then turned to the right side.  An open-ended ureteral catheter was inserted just within the UO and a gentle retrograde pyelogram was performed.  There was a kink in the proximal ureter with some dilation just proximal to this kink but no obvious filling defect.  The upper tract actually appeared relatively unremarkable.  At this point, I have fairly low suspicion for upper tract pathology.  I advanced a Super Stiff wire up to the level of the kidney.  I used this as the working wire to very easily advance a 7 [place] flexible digital ureteroscope up to level the renal pelvis.  I did not use a safety wire as it went extremely easily.  Each every calyx was then directly visualized.  There is absolutely no upper tract pathology, no erythema, no clots, or any other tissue worrisome for malignancy.  Is very reassuring.  I injected 1 more dose of contrast tissue the each and every calyx was visualized, there is no contrast extravasation and no pathology identified.  Once I felt that it adequately surveyed the upper tract, I slowly backed the scope down the length of the ureter.  There was a kink in the proximal ureter however there were no filling defects, strictures, or any other concerning findings.  The remainder of the ureter was completely unremarkable with no significant trauma from the ureteroscopy appreciated.  As such, I did not leave a stent on this side.  Contrast drained quickly.  Finally, the bladder was then drained.  The patient was  then cleaned and dried, repositioned in the supine position, reversed from anesthesia, and taken to the PACU in stable condition.  Plan: We will have her return next week for cystoscopy, stent removal.  Vanna Scotland, M.D.

## 2021-04-12 NOTE — Transfer of Care (Signed)
Immediate Anesthesia Transfer of Care Note  Patient: Lauren White  Procedure(s) Performed: CYSTOSCOPY/URETEROSCOPY/HOLMIUM LASER/STENT PLACEMENT (Left Ureter) URETEROSCOPY (Right Ureter) CYSTOSCOPY WITH RETROGRADE PYELOGRAM (Bilateral Ureter)  Patient Location: PACU  Anesthesia Type:General  Level of Consciousness: sedated  Airway & Oxygen Therapy: Patient Spontanous Breathing and Patient connected to face mask oxygen  Post-op Assessment: Report given to RN and Post -op Vital signs reviewed and stable  Post vital signs: Reviewed and stable  Last Vitals:  Vitals Value Taken Time  BP    Temp    Pulse 61 04/12/21 1129  Resp 10 04/12/21 1129  SpO2 90 % 04/12/21 1129  Vitals shown include unvalidated device data.  Last Pain:  Vitals:   04/12/21 0902  TempSrc: Tympanic  PainSc: 0-No pain         Complications: No complications documented.

## 2021-04-13 ENCOUNTER — Encounter: Payer: Self-pay | Admitting: Urology

## 2021-04-17 NOTE — Progress Notes (Signed)
   04/18/2021   CC:  Chief Complaint  Patient presents with  . Cysto Stent Removal    HPI: Lauren White is a 75 y.o. female who presents today for a left ureteral orifice stent removal.  She underwent bilateral ureteroscopy, diagnostic on the right which showed no upper tract pathology.  This was discussed today.  She also had a left-sided ureteral calculus treated.  She presents today for stent removal.  She was seen at her primary care physician yesterday for other issues.  Her urine was checked and was noted to be concerning for "infection" although she has a stent.  She was prescribed Keflex but is yet to start this medication.  Urinalysis today is consistent with stent  Blood pressure (!) 148/79, pulse 77. NED. A&Ox3.   No respiratory distress   Abd soft, NT, ND  She was administered 1 g of ceftriaxone prophylactically  Cystoscopy/Stent removal procedure  Patient identification was confirmed, informed consent was obtained, and patient was prepped using Betadine solution.  Lidocaine jelly was administered per urethral meatus.    Preoperative abx where received prior to procedure.    Procedure: - Flexible cystoscope introduced, without any difficulty.   - Thorough search of the bladder revealed:    normal urethral meatus  Stent seen emanating from left ureteral orifice, grasped with stent graspers, and removed in entirety.    Moderate debris in bladder limiting visualization.  Post-Procedure: - Patient tolerated the procedure well   Assessment/ Plan:  1. Left ureteral stone S/p successful ureteral stent removal today.  No complications.   Discuss return precautions etc.    Follow Up: F/u in 4 weeks w [facility]  I, Wyn Quaker, am acting as a scribe for Dr. Vanna Scotland.    I have reviewed the above documentation for accuracy and completeness, and I agree with the above.   Vanna Scotland, MD

## 2021-04-18 ENCOUNTER — Ambulatory Visit (INDEPENDENT_AMBULATORY_CARE_PROVIDER_SITE_OTHER): Payer: Medicare Other | Admitting: Urology

## 2021-04-18 ENCOUNTER — Other Ambulatory Visit: Payer: Self-pay

## 2021-04-18 VITALS — BP 148/79 | HR 77

## 2021-04-18 DIAGNOSIS — N201 Calculus of ureter: Secondary | ICD-10-CM

## 2021-04-18 LAB — BLADDER SCAN AMB NON-IMAGING: Scan Result: 17

## 2021-04-18 MED ORDER — CEFTRIAXONE SODIUM 1 G IJ SOLR
1.0000 g | INTRAMUSCULAR | Status: DC
  Administered 2021-04-18: 1 g via INTRAMUSCULAR

## 2021-04-18 NOTE — Progress Notes (Signed)
IM Injection  Patient is present today for an IM Injection for treatment of STENT Drug: Ceftriaxone Dose:1g Location:Left ventrogluteal  Lot: [id number] Exp:03/2020 Patient tolerated well, no complications were noted  Preformed by: Gerarda Gunther, RMA  Additional notes/ Follow up: 4 weeks as directed with [facility]

## 2021-04-20 LAB — URINALYSIS, COMPLETE
Bilirubin, UA: NEGATIVE
Glucose, UA: NEGATIVE
Ketones, UA: NEGATIVE
Nitrite, UA: NEGATIVE
Specific Gravity, UA: 1.01 (ref 1.005–1.030)
Urobilinogen, Ur: 0.2 mg/dL (ref 0.2–1.0)
pH, UA: 6 (ref 5.0–7.5)

## 2021-04-20 LAB — MICROSCOPIC EXAMINATION
Bacteria, UA: NONE SEEN
RBC, Urine: >30 /hpf — AB (ref 0–2)

## 2021-05-24 ENCOUNTER — Ambulatory Visit: Payer: Medicare Other

## 2021-05-29 ENCOUNTER — Ambulatory Visit: Payer: Self-pay | Admitting: Urology

## 2021-06-13 ENCOUNTER — Other Ambulatory Visit: Payer: Self-pay | Admitting: Internal Medicine

## 2021-06-13 DIAGNOSIS — Z1231 Encounter for screening mammogram for malignant neoplasm of breast: Secondary | ICD-10-CM

## 2021-07-13 ENCOUNTER — Ambulatory Visit
Admission: RE | Admit: 2021-07-13 | Discharge: 2021-07-13 | Disposition: A | Discharge status: Home / Self Care | Payer: Medicare Other | Source: Ambulatory Visit | Attending: Internal Medicine | Admitting: Internal Medicine

## 2021-07-13 ENCOUNTER — Other Ambulatory Visit: Payer: Self-pay

## 2021-07-13 DIAGNOSIS — Z1231 Encounter for screening mammogram for malignant neoplasm of breast: Secondary | ICD-10-CM | POA: Diagnosis present

## 2022-06-06 ENCOUNTER — Other Ambulatory Visit: Payer: Self-pay | Admitting: Internal Medicine

## 2022-06-06 DIAGNOSIS — Z1231 Encounter for screening mammogram for malignant neoplasm of breast: Secondary | ICD-10-CM

## 2022-07-15 ENCOUNTER — Ambulatory Visit
Admission: RE | Admit: 2022-07-15 | Discharge: 2022-07-15 | Disposition: A | Discharge status: Home / Self Care | Payer: Medicare Other | Source: Ambulatory Visit | Attending: Internal Medicine | Admitting: Internal Medicine

## 2022-07-15 DIAGNOSIS — Z1231 Encounter for screening mammogram for malignant neoplasm of breast: Secondary | ICD-10-CM | POA: Diagnosis present

## 2022-07-31 ENCOUNTER — Other Ambulatory Visit (INDEPENDENT_AMBULATORY_CARE_PROVIDER_SITE_OTHER): Payer: Self-pay | Admitting: Nurse Practitioner

## 2022-07-31 DIAGNOSIS — I6523 Occlusion and stenosis of bilateral carotid arteries: Secondary | ICD-10-CM

## 2022-08-01 ENCOUNTER — Ambulatory Visit (INDEPENDENT_AMBULATORY_CARE_PROVIDER_SITE_OTHER): Payer: Medicare Other

## 2022-08-01 ENCOUNTER — Ambulatory Visit (INDEPENDENT_AMBULATORY_CARE_PROVIDER_SITE_OTHER): Payer: Medicare Other | Admitting: Vascular Surgery

## 2022-08-01 ENCOUNTER — Encounter (INDEPENDENT_AMBULATORY_CARE_PROVIDER_SITE_OTHER): Payer: Self-pay | Admitting: Vascular Surgery

## 2022-08-01 VITALS — BP 137/61 | HR 65 | Resp 15 | Wt 118.4 lb

## 2022-08-01 DIAGNOSIS — I6523 Occlusion and stenosis of bilateral carotid arteries: Secondary | ICD-10-CM

## 2022-08-01 DIAGNOSIS — K219 Gastro-esophageal reflux disease without esophagitis: Secondary | ICD-10-CM | POA: Diagnosis not present

## 2022-08-01 DIAGNOSIS — E119 Type 2 diabetes mellitus without complications: Secondary | ICD-10-CM | POA: Diagnosis not present

## 2022-08-01 DIAGNOSIS — I480 Paroxysmal atrial fibrillation: Secondary | ICD-10-CM | POA: Diagnosis not present

## 2022-08-01 DIAGNOSIS — E782 Mixed hyperlipidemia: Secondary | ICD-10-CM

## 2022-08-03 ENCOUNTER — Encounter (INDEPENDENT_AMBULATORY_CARE_PROVIDER_SITE_OTHER): Payer: Self-pay | Admitting: Vascular Surgery

## 2022-08-03 NOTE — Progress Notes (Signed)
MRN : [id number]  Lauren White is a 76 y.o. ([birth year]) female who presents with chief complaint of check carotid arteries.  History of Present Illness:  The patient is seen for follow up evaluation of carotid stenosis. The carotid stenosis followed by ultrasound.    The patient denies amaurosis fugax. There is no recent history of TIA symptoms or focal motor deficits. There is no prior documented CVA.   The patient is taking enteric-coated aspirin 81 mg daily.   There is no history of migraine headaches. There is no history of seizures.   The patient has a history of coronary artery disease, no recent episodes of angina or shortness of breath. The patient denies PAD or claudication symptoms. There is a history of hyperlipidemia which is being treated with a statin.     Carotid Duplex done today shows <40% stenosis bilaterally.  Apparent decrease compared to last study.  Current Meds  Medication Sig   aspirin 81 MG chewable tablet Chew 81 mg by mouth daily.   atorvastatin (LIPITOR) 20 MG tablet Take 20 mg by mouth every evening.   citalopram (CELEXA) 40 MG tablet Take 40 mg by mouth daily.    lansoprazole (PREVACID) 15 MG capsule Take 15 mg by mouth daily.   levothyroxine (SYNTHROID, LEVOTHROID) 75 MCG tablet Take 75 mcg by mouth daily before breakfast.   Magnesium Oxide 250 MG TABS Take 250 mg by mouth daily.   Melatonin 3 MG TABS Take 3 mg by mouth at bedtime as needed (sleep).   Multiple Vitamin (MULTI-VITAMINS) TABS Take 1 tablet by mouth daily.   naproxen sodium (ALEVE) 220 MG tablet Take 220 mg by mouth daily as needed.   Omega-3 Fatty Acids (FISH OIL PO) Take 1,200 mg by mouth daily.   Current Facility-Administered Medications for the 08/01/22 encounter (Office Visit) with Gilda Crease, Latina Craver, MD  Medication   cefTRIAXone (ROCEPHIN) injection 1 g    Past Medical History:  Diagnosis Date   Anxiety    Arthritis    Borderline glaucoma    Depression     Dysrhythmia    HISTORY OF SVT, performed a cardiac ablation   Hematuria 04/07/2021   History of kidney stones    Hyperlipidemia    Hypothyroidism    Personal history of tobacco use, presenting hazards to health 08/24/2015   Pre-diabetes     Past Surgical History:  Procedure Laterality Date   ABDOMINAL HYSTERECTOMY     APPENDECTOMY     CARDIAC ELECTROPHYSIOLOGY MAPPING AND ABLATION     CYSTOSCOPY W/ RETROGRADES Bilateral 04/12/2021   Procedure: CYSTOSCOPY WITH RETROGRADE PYELOGRAM;  Surgeon: Vanna Scotland, MD;  Location: [facility];  Service: Urology;  Laterality: Bilateral;   CYSTOSCOPY/URETEROSCOPY/HOLMIUM LASER/STENT PLACEMENT Left 04/12/2021   Procedure: CYSTOSCOPY/URETEROSCOPY/HOLMIUM LASER/STENT PLACEMENT;  Surgeon: Vanna Scotland, MD;  Location: [facility];  Service: Urology;  Laterality: Left;   EYE SURGERY Left 03/13/2021   cataract and vitrectomy   URETEROSCOPY Right 04/12/2021   Procedure: URETEROSCOPY;  Surgeon: Vanna Scotland, MD;  Location: [facility];  Service: Urology;  Laterality: Right;    Social History Social History   Tobacco Use   Smoking status: Former    Packs/day: 1.00    Years: 40.00    Total pack years: 40.00    Types: Cigarettes    Quit date: 2006    Years since quitting: 17.6   Smokeless tobacco: Never   Tobacco comments:    quit 2006  Vaping Use   Vaping  Use: Never used  Substance Use Topics   Alcohol use: No   Drug use: No    Family History Family History  Problem Relation Age of Onset   Breast cancer Neg Hx     Allergies  Allergen Reactions   Diphenhydramine Hcl Swelling    Increases pressure in the eyes due to glaucoma   Etodolac Other (See Comments)    Chest pain   Omeprazole Other (See Comments)    Chest pain   Tramadol Other (See Comments)    Difficulty breathing   Nickel Rash   Povidone-Iodine Rash     REVIEW OF SYSTEMS (Negative unless checked)  Constitutional: [] Weight loss  [] Fever  [] Chills Cardiac: [] Chest pain    [] Chest pressure   [] Palpitations   [] Shortness of breath when laying flat   [] Shortness of breath with exertion. Vascular:  [x] Pain in legs with walking   [] Pain in legs at rest  [] History of DVT   [] Phlebitis   [] Swelling in legs   [] Varicose veins   [] Non-healing ulcers Pulmonary:   [] Uses home oxygen   [] Productive cough   [] Hemoptysis   [] Wheeze  [] COPD   [] Asthma Neurologic:  [] Dizziness   [] Seizures   [] History of stroke   [] History of TIA  [] Aphasia   [] Vissual changes   [] Weakness or numbness in arm   [] Weakness or numbness in leg Musculoskeletal:   [] Joint swelling   [] Joint pain   [] Low back pain Hematologic:  [] Easy bruising  [] Easy bleeding   [] Hypercoagulable state   [] Anemic Gastrointestinal:  [] Diarrhea   [] Vomiting  [x] Gastroesophageal reflux/heartburn   [] Difficulty swallowing. Genitourinary:  [] Chronic kidney disease   [] Difficult urination  [] Frequent urination   [] Blood in urine Skin:  [] Rashes   [] Ulcers  Psychological:  [] History of anxiety   []  History of major depression.  Physical Examination  Vitals:   08/01/22 1411  BP: 137/61  Pulse: 65  Resp: 15  Weight: 118 lb 6.4 oz (53.7 kg)   Body mass index is 23.12 kg/m. Gen: WD/WN, NAD Head: [place]/AT, No temporalis wasting.  Ear/Nose/Throat: Hearing grossly intact, nares w/o erythema or drainage Eyes: PER, EOMI, sclera nonicteric.  Neck: Supple, no masses.  No bruit or JVD.  Pulmonary:  Good air movement, no audible wheezing, no use of accessory muscles.  Cardiac: RRR, normal S1, S2, no Murmurs. Vascular:  carotid bruit noted Vessel Right Left  Radial Palpable Palpable  Carotid  Palpable  Palpable  Subclav  Palpable Palpable  Gastrointestinal: soft, non-distended. No guarding/no peritoneal signs.  Musculoskeletal: M/S 5/5 throughout.  No visible deformity.  Neurologic: CN 2-12 intact. Pain and light touch intact in extremities.  Symmetrical.  Speech is fluent. Motor exam as listed above. Psychiatric: Judgment  intact, Mood & affect appropriate for pt's clinical situation. Dermatologic: No rashes or ulcers noted.  No changes consistent with cellulitis.   CBC Lab Results  Component Value Date   WBC 10.9 (H) 04/07/2021   HGB 11.1 (L) 04/07/2021   HCT 33.8 (L) 04/07/2021   MCV 88.4 04/07/2021   PLT 175 04/07/2021    BMET    Component Value Date/Time   NA 141 04/07/2021 0542   NA 141 01/11/2013 1558   K 4.7 04/07/2021 0542   K 3.9 01/11/2013 1558   CL 107 04/07/2021 0542   CL 108 (H) 01/11/2013 1558   CO2 29 04/07/2021 0542   CO2 25 01/11/2013 1558   GLUCOSE 103 (H) 04/07/2021 0542   GLUCOSE 108 (H) 01/11/2013 1558  BUN 14 04/07/2021 0542   BUN 15 01/11/2013 1558   CREATININE 0.66 04/07/2021 0542   CREATININE 0.70 01/11/2013 1558   CALCIUM 8.8 (L) 04/07/2021 0542   CALCIUM 8.9 01/11/2013 1558   GFRNONAA >60 04/07/2021 0542   GFRNONAA >60 01/11/2013 1558   GFRAA >60 01/11/2013 1558   CrCl cannot be calculated (Patient's most recent lab result is older than the maximum 21 days allowed.).  COAG No results found for: "INR", "PROTIME"  Radiology MM 3D SCREEN BREAST BILATERAL  Result Date: 07/16/2022 CLINICAL DATA:  Screening. EXAM: DIGITAL SCREENING BILATERAL MAMMOGRAM WITH TOMOSYNTHESIS AND CAD TECHNIQUE: Bilateral screening digital craniocaudal and mediolateral oblique mammograms were obtained. Bilateral screening digital breast tomosynthesis was performed. The images were evaluated with computer-aided detection. COMPARISON:  Previous exam(s). ACR Breast Density Category b: There are scattered areas of fibroglandular density. FINDINGS: There are no findings suspicious for malignancy. IMPRESSION: No mammographic evidence of malignancy. A result letter of this screening mammogram will be mailed directly to the patient. RECOMMENDATION: Screening mammogram in one year. (Code:SM-B-01Y) BI-RADS CATEGORY  1: Negative. Electronically Signed   By: Romona Curls M.D.   On: 07/16/2022 16:21      Assessment/Plan 1. Bilateral carotid artery stenosis Recommend:  Given the patient's asymptomatic subcritical stenosis no further invasive testing or surgery at this time.  Duplex ultrasound shows 1-39% stenosis bilaterally.  Continue antiplatelet therapy as prescribed Continue management of CAD, HTN and Hyperlipidemia Healthy heart diet,  encouraged exercise at least 4 times per week Follow up in 24 months with duplex ultrasound and physical exam    2. Paroxysmal atrial fibrillation (HCC) Continue antiarrhythmia medications as already ordered, these medications have been reviewed and there are no changes at this time.  Continue anticoagulation as ordered by Cardiology Service   3. Gastroesophageal reflux disease without esophagitis Continue PPI as already ordered, this medication has been reviewed and there are no changes at this time.  Avoidence of caffeine and alcohol  Moderate elevation of the head of the bed    4. Type 2 diabetes mellitus without complication, unspecified whether long term insulin use (HCC) Continue hypoglycemic medications as already ordered, these medications have been reviewed and there are no changes at this time.  Hgb A1C to be monitored as already arranged by primary service   5. Mixed hyperlipidemia Continue statin as ordered and reviewed, no changes at this time     Levora Dredge, MD  08/03/2022 11:48 AM

## 2022-10-30 DIAGNOSIS — K7689 Other specified diseases of liver: Secondary | ICD-10-CM

## 2022-10-30 HISTORY — DX: Other specified diseases of liver: K76.89

## 2022-11-26 ENCOUNTER — Emergency Department
Admission: EM | Admit: 2022-11-26 | Discharge: 2022-11-26 | Disposition: A | Discharge status: Home / Self Care | Payer: Medicare Other | Attending: Emergency Medicine | Admitting: Emergency Medicine

## 2022-11-26 ENCOUNTER — Encounter: Payer: Self-pay | Admitting: Emergency Medicine

## 2022-11-26 ENCOUNTER — Other Ambulatory Visit: Payer: Self-pay

## 2022-11-26 ENCOUNTER — Emergency Department: Payer: Medicare Other

## 2022-11-26 DIAGNOSIS — W19XXXA Unspecified fall, initial encounter: Secondary | ICD-10-CM | POA: Diagnosis not present

## 2022-11-26 DIAGNOSIS — K7689 Other specified diseases of liver: Secondary | ICD-10-CM | POA: Diagnosis not present

## 2022-11-26 DIAGNOSIS — R109 Unspecified abdominal pain: Secondary | ICD-10-CM | POA: Diagnosis present

## 2022-11-26 DIAGNOSIS — E119 Type 2 diabetes mellitus without complications: Secondary | ICD-10-CM | POA: Insufficient documentation

## 2022-11-26 LAB — COMPREHENSIVE METABOLIC PANEL
ALT: 220 U/L — ABNORMAL HIGH (ref 0–44)
AST: 150 U/L — ABNORMAL HIGH (ref 15–41)
Albumin: 3.7 g/dL (ref 3.5–5.0)
Alkaline Phosphatase: 612 U/L — ABNORMAL HIGH (ref 38–126)
Anion gap: 7 (ref 5–15)
BUN: 14 mg/dL (ref 8–23)
CO2: 26 mmol/L (ref 22–32)
Calcium: 9.1 mg/dL (ref 8.9–10.3)
Chloride: 106 mmol/L (ref 98–111)
Creatinine, Ser: 0.72 mg/dL (ref 0.44–1.00)
GFR, Estimated: >60 mL/min (ref >60)
Glucose, Bld: 199 mg/dL — ABNORMAL HIGH (ref 70–99)
Potassium: 3.6 mmol/L (ref 3.5–5.1)
Sodium: 139 mmol/L (ref 135–145)
Total Bilirubin: 1.2 mg/dL (ref 0.3–1.2)
Total Protein: 7.2 g/dL (ref 6.5–8.1)

## 2022-11-26 LAB — URINALYSIS, ROUTINE W REFLEX MICROSCOPIC
Bacteria, UA: NONE SEEN
Bilirubin Urine: NEGATIVE
Glucose, UA: NEGATIVE mg/dL
Hgb urine dipstick: NEGATIVE
Ketones, ur: NEGATIVE mg/dL
Nitrite: NEGATIVE
Protein, ur: NEGATIVE mg/dL
Specific Gravity, Urine: 1.035 — ABNORMAL HIGH (ref 1.005–1.030)
pH: 6 (ref 5.0–8.0)

## 2022-11-26 LAB — CBC
HCT: 31.8 % — ABNORMAL LOW (ref 36.0–46.0)
Hemoglobin: 10.5 g/dL — ABNORMAL LOW (ref 12.0–15.0)
MCH: 28.6 pg (ref 26.0–34.0)
MCHC: 33 g/dL (ref 30.0–36.0)
MCV: 86.6 fL (ref 80.0–100.0)
Platelets: 255 10*3/uL (ref 150–400)
RBC: 3.67 MIL/uL — ABNORMAL LOW (ref 3.87–5.11)
RDW: 14.3 % (ref 11.5–15.5)
WBC: 9.7 10*3/uL (ref 4.0–10.5)
nRBC: 0 % (ref 0.0–0.2)

## 2022-11-26 LAB — LIPASE, BLOOD: Lipase: 61 U/L — ABNORMAL HIGH (ref 11–51)

## 2022-11-26 MED ORDER — IOHEXOL 300 MG/ML  SOLN
80.0000 mL | Freq: Once | INTRAMUSCULAR | Status: AC | PRN
  Administered 2022-11-26: 80 mL via INTRAVENOUS

## 2022-11-26 NOTE — ED Provider Notes (Signed)
[facility] Provider Note    Event Date/Time   First MD Initiated Contact with Patient 11/26/22 1019     (approximate)   History   Abdominal Pain   HPI  Lauren White is a 76 y.o. female with a history of diabetes who presents with complaints of right-sided abdominal discomfort for approximately 1 week.  Patient reports she had a significant fall 2 weeks ago and she reports at that time she landed on her right side.  Initially she was not having any pain in her right abdomen however several days after the fall she started to have discomfort in her right abdomen.  She reports she has had constipation and has some difficulty with p.o.'s as well now.  She is not on blood thinners     Physical Exam   Triage Vital Signs: ED Triage Vitals  Enc Vitals Group     BP 11/26/22 0958 (!) 147/73     Pulse Rate 11/26/22 0958 80     Resp 11/26/22 0958 18     Temp 11/26/22 0958 98.3 F (36.8 C)     Temp Source 11/26/22 0958 Oral     SpO2 11/26/22 0958 100 %     Weight 11/26/22 0952 54 kg (119 lb 0.8 oz)     Height 11/26/22 0952 1.524 m (5')     Head Circumference --      Peak Flow --      Pain Score --      Pain Loc --      Pain Edu? --      Excl. in GC? --     Most recent vital signs: Vitals:   11/26/22 0958  BP: (!) 147/73  Pulse: 80  Resp: 18  Temp: 98.3 F (36.8 C)  SpO2: 100%     General: Awake, no distress.  CV:  Good peripheral perfusion.  Resp:  Normal effort.  Abd:  Mild distention, tenderness palpation along the right upper quadrant Other:     ED Results / Procedures / Treatments   Labs (all labs ordered are listed, but only abnormal results are displayed) Labs Reviewed  LIPASE, BLOOD - Abnormal; Notable for the following components:      Result Value   Lipase 61 (*)    All other components within normal limits  COMPREHENSIVE METABOLIC PANEL - Abnormal; Notable for the following components:   Glucose, Bld 199 (*)    AST  150 (*)    ALT 220 (*)    Alkaline Phosphatase 612 (*)    All other components within normal limits  CBC - Abnormal; Notable for the following components:   RBC 3.67 (*)    Hemoglobin 10.5 (*)    HCT 31.8 (*)    All other components within normal limits  URINALYSIS, ROUTINE W REFLEX MICROSCOPIC - Abnormal; Notable for the following components:   Color, Urine YELLOW (*)    APPearance HAZY (*)    Specific Gravity, Urine 1.035 (*)    Leukocytes,Ua MODERATE (*)    All other components within normal limits     EKG     RADIOLOGY CT viewed interpreted by me, large hepatic cyst    PROCEDURES:  Critical Care performed:   Procedures   MEDICATIONS ORDERED IN ED: Medications  iohexol (OMNIPAQUE) 300 MG/ML solution 80 mL (80 mLs Intravenous Contrast Given 11/26/22 1113)     IMPRESSION / MDM / ASSESSMENT AND PLAN / ED COURSE  I reviewed the triage  vital signs and the nursing notes. Patient's presentation is most consistent with acute presentation with potential threat to life or bodily function.  Patient presents with right-sided abdominal pain status post fall as detailed above.  Differential includes hematoma, liver lack, SBO  Will obtain CT abdomen pelvis, labs  Lab work notable for elevated AST and ALT and alkaline phosphatase  CT scan demonstrates large hepatic cyst with possible mass effect on the stomach  Discussed CT results with Dr. Everlene Farrier, recommended consultation with tertiary care center.  Discussed with Dr. Marval Regal at [facility] who has studied the images and recommends outpatient follow-up in his office        FINAL CLINICAL IMPRESSION(S) / ED DIAGNOSES   Final diagnoses:  Hepatic cyst     Rx / DC Orders   ED Discharge Orders     None        Note:  This document was prepared using Dragon voice recognition software and may include unintentional dictation errors.   Jene Every, MD 11/26/22 1430

## 2022-11-26 NOTE — ED Triage Notes (Signed)
Presents with pain to right side of abd   States she had a fall a few weeks ago  prior to developing abd pain  Now also having some n/v  and constipation

## 2022-11-26 NOTE — ED Notes (Signed)
called to [facility] per MD Kinner/imaging powershared/faxed facesheet/rep:dana.

## 2022-11-26 NOTE — ED Provider Triage Note (Signed)
Emergency Medicine Provider Triage Evaluation Note  JAMIAYA BINA , a 76 y.o. female  was evaluated in triage.  Pt complains of abd pain, unable to have bm now vomiting when she eats, concerned about bowel obstruction.  Review of Systems  Positive:  Negative:   Physical Exam  Ht 5' (1.524 m)   Wt 54 kg   BMI 23.25 kg/m  Gen:   Awake, no distress   Resp:  Normal effort  MSK:   Moves extremities without difficulty  Other:  Abd tender , more in ruq, bs present in all 4 quads  Medical Decision Making  Medically screening exam initiated at 10:00 AM.  Appropriate orders placed.  KARLIAH KOWALCHUK was informed that the remainder of the evaluation will be completed by another provider, this initial triage assessment does not replace that evaluation, and the importance of remaining in the ED until their evaluation is complete.     Faythe Ghee, PA-C 11/26/22 1001

## 2022-11-26 NOTE — Discharge Instructions (Signed)
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  It may be starting to affect your stomach and gallbladder.  Dr. Marval Regal will schedule an appointment with you to discuss further treatment options

## 2022-11-27 ENCOUNTER — Emergency Department
Admission: EM | Admit: 2022-11-27 | Discharge: 2022-11-28 | Disposition: A | Discharge status: Other Acute Inpatient Hospital | Payer: Medicare Other | Attending: Emergency Medicine | Admitting: Emergency Medicine

## 2022-11-27 ENCOUNTER — Other Ambulatory Visit: Payer: Self-pay

## 2022-11-27 DIAGNOSIS — E119 Type 2 diabetes mellitus without complications: Secondary | ICD-10-CM | POA: Diagnosis not present

## 2022-11-27 DIAGNOSIS — K831 Obstruction of bile duct: Secondary | ICD-10-CM | POA: Diagnosis not present

## 2022-11-27 DIAGNOSIS — K7689 Other specified diseases of liver: Secondary | ICD-10-CM | POA: Insufficient documentation

## 2022-11-27 DIAGNOSIS — K311 Adult hypertrophic pyloric stenosis: Secondary | ICD-10-CM | POA: Insufficient documentation

## 2022-11-27 DIAGNOSIS — R109 Unspecified abdominal pain: Secondary | ICD-10-CM | POA: Diagnosis present

## 2022-11-27 LAB — CBC
HCT: 34 % — ABNORMAL LOW (ref 36.0–46.0)
Hemoglobin: 11.3 g/dL — ABNORMAL LOW (ref 12.0–15.0)
MCH: 29 pg (ref 26.0–34.0)
MCHC: 33.2 g/dL (ref 30.0–36.0)
MCV: 87.4 fL (ref 80.0–100.0)
Platelets: 302 10*3/uL (ref 150–400)
RBC: 3.89 MIL/uL (ref 3.87–5.11)
RDW: 14.6 % (ref 11.5–15.5)
WBC: 13.2 10*3/uL — ABNORMAL HIGH (ref 4.0–10.5)
nRBC: 0 % (ref 0.0–0.2)

## 2022-11-27 LAB — COMPREHENSIVE METABOLIC PANEL
ALT: 185 U/L — ABNORMAL HIGH (ref 0–44)
AST: 109 U/L — ABNORMAL HIGH (ref 15–41)
Albumin: 3.6 g/dL (ref 3.5–5.0)
Alkaline Phosphatase: 654 U/L — ABNORMAL HIGH (ref 38–126)
Anion gap: 10 (ref 5–15)
BUN: 14 mg/dL (ref 8–23)
CO2: 26 mmol/L (ref 22–32)
Calcium: 8.8 mg/dL — ABNORMAL LOW (ref 8.9–10.3)
Chloride: 107 mmol/L (ref 98–111)
Creatinine, Ser: 0.83 mg/dL (ref 0.44–1.00)
GFR, Estimated: >60 mL/min (ref >60)
Glucose, Bld: 159 mg/dL — ABNORMAL HIGH (ref 70–99)
Potassium: 4.2 mmol/L (ref 3.5–5.1)
Sodium: 143 mmol/L (ref 135–145)
Total Bilirubin: 1.6 mg/dL — ABNORMAL HIGH (ref 0.3–1.2)
Total Protein: 7.4 g/dL (ref 6.5–8.1)

## 2022-11-27 LAB — LIPASE, BLOOD: Lipase: 48 U/L (ref 11–51)

## 2022-11-27 MED ORDER — DEXTROSE-NACL 5-0.45 % IV SOLN: INTRAVENOUS | Status: AC

## 2022-11-27 MED ORDER — HYDROMORPHONE HCL 1 MG/ML IJ SOLN
0.5000 mg | INTRAMUSCULAR | Status: DC | PRN
  Administered 2022-11-27 – 2022-11-28 (×2): 0.5 mg via INTRAVENOUS
  Filled 2022-11-27 (×2): qty 0.5

## 2022-11-27 MED ORDER — ONDANSETRON HCL 4 MG/2ML IJ SOLN
4.0000 mg | Freq: Once | INTRAMUSCULAR | Status: AC
  Administered 2022-11-27: 4 mg via INTRAVENOUS
  Filled 2022-11-27: qty 2

## 2022-11-27 MED ORDER — LACTATED RINGERS IV BOLUS
1000.0000 mL | Freq: Once | INTRAVENOUS | Status: AC
  Administered 2022-11-27: 1000 mL via INTRAVENOUS

## 2022-11-27 MED ORDER — MORPHINE SULFATE (PF) 4 MG/ML IV SOLN
4.0000 mg | Freq: Once | INTRAVENOUS | Status: AC
  Administered 2022-11-27: 4 mg via INTRAVENOUS
  Filled 2022-11-27: qty 1

## 2022-11-27 MED ORDER — HYDROMORPHONE HCL 1 MG/ML IJ SOLN
0.5000 mg | Freq: Once | INTRAMUSCULAR | Status: AC
  Administered 2022-11-27: 0.5 mg via INTRAVENOUS
  Filled 2022-11-27: qty 0.5

## 2022-11-27 NOTE — ED Notes (Signed)
[facility] accepted patient pending bed assignment per [facility]

## 2022-11-27 NOTE — ED Notes (Signed)
Called [facility] spoke to [facility] for transfer  images powershared  [phone]

## 2022-11-27 NOTE — ED Provider Notes (Signed)
[facility] Provider Note    Event Date/Time   First MD Initiated Contact with Patient 11/27/22 1507     (approximate)   History   Chief Complaint Abdominal Pain   HPI  Lauren White is a 76 y.o. female with past medical history of hyperlipidemia, diabetes, atrial fibrillation, GERD, and peptic ulcer disease who presents to the ED complaining of abdominal pain.  Patient reports that she has had about 1 week of worsening pain in the right upper quadrant of her abdomen.  She attributes this to a fall that she had about 2 weeks ago when she landed on her right side.  She has been dealing with increasing difficulty eating and drinking with the pain, states she had vomited yesterday and is only able to eat very small amounts of solids and drink small amounts of water.  She feels nauseous with any attempt to eat, states she has not had a bowel movement in a while but attributes this to her lack of intake.  She denies any difficulty urinating and has not had any dysuria.     Physical Exam   Triage Vital Signs: ED Triage Vitals  Enc Vitals Group     BP 11/27/22 1343 (!) 119/58     Pulse Rate 11/27/22 1343 74     Resp 11/27/22 1343 16     Temp 11/27/22 1343 97.8 F (36.6 C)     Temp Source 11/27/22 1343 Oral     SpO2 11/27/22 1343 95 %     Weight 11/27/22 1344 119 lb 0.8 oz (54 kg)     Height 11/27/22 1344 5' (1.524 m)     Head Circumference --      Peak Flow --      Pain Score 11/27/22 1343 10     Pain Loc --      Pain Edu? --      Excl. in [place]? --     Most recent vital signs: Vitals:   11/27/22 1639 11/27/22 1641  BP: (!) 162/82   Pulse: 75   Resp: 18   Temp:  97.9 F (36.6 C)  SpO2: 94%     Constitutional: Alert and oriented. Eyes: Conjunctivae are normal. Head: Atraumatic. Nose: No congestion/rhinnorhea. Mouth/Throat: Mucous membranes are dry.  Cardiovascular: Normal rate, regular rhythm. Grossly normal heart sounds.  2+ radial  pulses bilaterally. Respiratory: Normal respiratory effort.  No retractions. Lungs CTAB. Gastrointestinal: Soft and tender to palpation in the right upper and right lower quadrant with voluntary guarding. No distention. Musculoskeletal: No lower extremity tenderness nor edema.  Neurologic:  Normal speech and language. No gross focal neurologic deficits are appreciated.    ED Results / Procedures / Treatments   Labs (all labs ordered are listed, but only abnormal results are displayed) Labs Reviewed  COMPREHENSIVE METABOLIC PANEL - Abnormal; Notable for the following components:      Result Value   Glucose, Bld 159 (*)    Calcium 8.8 (*)    AST 109 (*)    ALT 185 (*)    Alkaline Phosphatase 654 (*)    Total Bilirubin 1.6 (*)    All other components within normal limits  CBC - Abnormal; Notable for the following components:   WBC 13.2 (*)    Hemoglobin 11.3 (*)    HCT 34.0 (*)    All other components within normal limits  LIPASE, BLOOD  URINALYSIS, ROUTINE W REFLEX MICROSCOPIC    PROCEDURES:  Critical Care  performed: No  Procedures   MEDICATIONS ORDERED IN ED: Medications  morphine (PF) 4 MG/ML injection 4 mg (4 mg Intravenous Given 11/27/22 1537)  ondansetron (ZOFRAN) injection 4 mg (4 mg Intravenous Given 11/27/22 1536)  lactated ringers bolus 1,000 mL (0 mLs Intravenous Stopped 11/27/22 1644)  HYDROmorphone (DILAUDID) injection 0.5 mg (0.5 mg Intravenous Given 11/27/22 1646)     IMPRESSION / MDM / ASSESSMENT AND PLAN / ED COURSE  I reviewed the triage vital signs and the nursing notes.                              76 y.o. female with past medical history of hyperlipidemia, diabetes, atrial fibrillation, GERD, and peptic ulcer disease who presents to the ED complaining of increasing pain in the right upper quadrant of her abdomen with difficulty eating or drinking since being seen in the ED for similar symptoms yesterday.  Patient's presentation is most  consistent with acute presentation with potential threat to life or bodily function.  Differential diagnosis includes, but is not limited to, hepatic cyst, ruptured hepatic cyst, cholecystitis, biliary colic, biliary obstruction, gastric outlet obstruction.  Patient uncomfortable appearing but in no acute distress, vital signs are unremarkable.  She does have significant tenderness over the entire right side of her abdomen with voluntary guarding.  Labs are overall comparable to those obtained yesterday with mild elevation in transaminases with more significant elevation in her alkaline phosphatase.  Bilirubin slightly uptrending from 1.2-1.6 and patient also with new mild leukocytosis at 13.2.  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  This is likely affecting her ability to eat and drink and abnormal LFTs.  While she has a leukocytosis otherwise no symptoms concerning for infection currently, although she would be at risk for developing cholangitis with biliary obstruction.  We will treat symptomatically with IV morphine and Zofran, she does appear dehydrated and will give IV fluid bolus.  Case discussed with Dr. Tonna Boehringer of general surgery here at [facility], again recommends transfer to tertiary care center.  We will reach out to [facility] for potential transfer.  Case discussed with Dr. Kathrynn Ducking of hepatobiliary surgery at [facility] and patient accepted for transfer, currently awaiting bed availability.  Patient and family updated on status of transfer to [facility], pain is well-controlled at this time.      FINAL CLINICAL IMPRESSION(S) / ED DIAGNOSES   Final diagnoses:  Hepatic cyst  Gastric outlet obstruction  Biliary obstruction     Rx / DC Orders   ED Discharge Orders     None        Note:  This document was prepared using Dragon voice recognition software and may include unintentional dictation  errors.   Chesley Noon, MD 11/27/22 [phone]

## 2022-11-27 NOTE — ED Triage Notes (Signed)
Pt here with abd pain via [facility]. Pt having RUQ abd pain. Pt was seen yesterday for the same. Pt pain worse today, pt took 1/2 of her oxycodone at 1230. A&&O x4 and ambulatory.   74 98% RA 140/67

## 2022-11-27 NOTE — ED Notes (Signed)
Called [facility] transfer [facility]) to check bed status..the patient remains on waitlist

## 2022-11-27 NOTE — ED Notes (Signed)
Pt reports that she feels like she is going to pass out. VS assessed. Pt placed into recliner chair to lay back.

## 2022-11-27 NOTE — Consult Note (Signed)
Patient ID: Lauren White, female   DOB: 1946-10-12, 76 y.o.   MRN: [id number]  HPI Lauren White is a 76 y.o. female seen in consultation at the request of Dr. Cyril Loosen.  She reports progressive onset of right upper quadrant pain that is moderate intermittent and has been present for about a week.  Pain is sharp.  Also over the last 48 hours having some intermittent nausea and vomiting she has been able to tolerate liquids difficulty with solids.  She denies any fevers or chills.  She also reports that a couple weeks ago she did have a fall. Note her surgical history includes an appendectomy as well as abdominal hysterectomy. She has known that she had a big liver cyst for several years. She did have a CT scan that I have personally review and I have also compared to the prior ones and there is a giant simple appearing right hepatic lobe cyst.  Measures 17 cm.  Also mild biliary dilation likely from compression secondary to the large cyst. Work included hemoglobin of 10.5 with normal platelets.  BMP shows mild elevation of the AST and ALT as well as alkaline phosphatase.  Total bilirubin is 1.2. Is noted that she denies any jaundice.  There is no evidence of biliary obstruction  HPI  Past Medical History:  Diagnosis Date   Anxiety    Arthritis    Borderline glaucoma    Depression    Dysrhythmia    HISTORY OF SVT, performed a cardiac ablation   Hematuria 04/07/2021   History of kidney stones    Hyperlipidemia    Hypothyroidism    Personal history of tobacco use, presenting hazards to health 08/24/2015   Pre-diabetes     Past Surgical History:  Procedure Laterality Date   ABDOMINAL HYSTERECTOMY     APPENDECTOMY     CARDIAC ELECTROPHYSIOLOGY MAPPING AND ABLATION     CYSTOSCOPY W/ RETROGRADES Bilateral 04/12/2021   Procedure: CYSTOSCOPY WITH RETROGRADE PYELOGRAM;  Surgeon: Vanna Scotland, MD;  Location: [facility] ORS;  Service: Urology;  Laterality: Bilateral;    CYSTOSCOPY/URETEROSCOPY/HOLMIUM LASER/STENT PLACEMENT Left 04/12/2021   Procedure: CYSTOSCOPY/URETEROSCOPY/HOLMIUM LASER/STENT PLACEMENT;  Surgeon: Vanna Scotland, MD;  Location: [facility] ORS;  Service: Urology;  Laterality: Left;   EYE SURGERY Left 03/13/2021   cataract and vitrectomy   URETEROSCOPY Right 04/12/2021   Procedure: URETEROSCOPY;  Surgeon: Vanna Scotland, MD;  Location: [facility] ORS;  Service: Urology;  Laterality: Right;    Family History  Problem Relation Age of Onset   Breast cancer Neg Hx     Social History Social History   Tobacco Use   Smoking status: Former    Packs/day: 1.00    Years: 40.00    Total pack years: 40.00    Types: Cigarettes    Quit date: 2006    Years since quitting: 17.9   Smokeless tobacco: Never   Tobacco comments:    quit 2006  Vaping Use   Vaping Use: Never used  Substance Use Topics   Alcohol use: No   Drug use: No    Allergies  Allergen Reactions   Diphenhydramine Hcl Swelling    Increases pressure in the eyes due to glaucoma   Etodolac Other (See Comments)    Chest pain   Omeprazole Other (See Comments)    Chest pain   Tramadol Other (See Comments)    Difficulty breathing   Nickel Rash   Povidone-Iodine Rash    Current Facility-Administered Medications  Medication Dose Route Frequency Provider  Last Rate Last Admin   cefTRIAXone (ROCEPHIN) injection 1 g  1 g Intramuscular Q24H Vanna Scotland, MD   1 g at 04/18/21 1603   Current Outpatient Medications  Medication Sig Dispense Refill   aspirin 81 MG chewable tablet Chew 81 mg by mouth daily.     atorvastatin (LIPITOR) 20 MG tablet Take 20 mg by mouth every evening.     Calcium Carbonate-Vitamin D 600-400 MG-UNIT tablet Take 1 tablet by mouth daily. (Patient not taking: Reported on 08/01/2022)     citalopram (CELEXA) 40 MG tablet Take 40 mg by mouth daily.      HYDROcodone-acetaminophen (NORCO/VICODIN) 5-325 MG tablet Take 1-2 tablets by mouth every 6 (six) hours as needed for  moderate pain. (Patient not taking: Reported on 08/01/2022) 10 tablet 0   lansoprazole (PREVACID) 15 MG capsule Take 15 mg by mouth daily.     levothyroxine (SYNTHROID, LEVOTHROID) 75 MCG tablet Take 75 mcg by mouth daily before breakfast.     Magnesium Oxide 250 MG TABS Take 250 mg by mouth daily.     Melatonin 3 MG TABS Take 3 mg by mouth at bedtime as needed (sleep).     Multiple Vitamin (MULTI-VITAMINS) TABS Take 1 tablet by mouth daily.     naproxen sodium (ALEVE) 220 MG tablet Take 220 mg by mouth daily as needed.     Omega-3 Fatty Acids (FISH OIL PO) Take 1,200 mg by mouth daily.     oxybutynin (DITROPAN) 5 MG tablet Take 1 tablet (5 mg total) by mouth every 8 (eight) hours as needed for bladder spasms. (Patient not taking: Reported on 08/01/2022) 30 tablet 0     Review of Systems Full ROS  was asked and was negative except for the information on the HPI  Physical Exam Blood pressure (!) 147/73, pulse 80, temperature 98.3 F (36.8 C), resp. rate 18, height 5' (1.524 m), weight 54 kg, SpO2 100 %. CONSTITUTIONAL: NAD. EYES: Pupils are equal, round, and reactive to light, Sclera are non-icteric. EARS, NOSE, MOUTH AND THROAT: . The oral mucosa is pink and moist. Hearing is intact to voice. LYMPH NODES:  Lymph nodes in the neck are normal. RESPIRATORY:  Lungs are clear. There is normal respiratory effort, with equal breath sounds bilaterally, and without pathologic use of accessory muscles. CARDIOVASCULAR: Heart is regular without murmurs, gallops, or rubs. GI: The abdomen is  soft, distended to palpation in the epigastric area and right upper quadrant.  There are no peritonitis.  No rebound.  Could not appreciate a discrete mass but there is definitely some fullness on her right upper quadrant.  There is no hepatosplenomegaly. There are normal bowel sounds  GU: Rectal deferred.   MUSCULOSKELETAL: Normal muscle strength and tone. No cyanosis or edema.   SKIN: Turgor is good and there are no  pathologic skin lesions or ulcers. NEUROLOGIC: Motor and sensation is grossly normal. Cranial nerves are grossly intact. PSYCH:  Oriented to person, place and time. Affect is normal.  Data Reviewed  I have personally reviewed the patient's imaging, laboratory findings and medical records.    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 intake.  Mass is causing compressive effects on pressing on the biliary system and this is likely the result of the increase LFTs.  Currently she does not have ascending cholangitis.  At this time I do not see  a reason to do any emergent surgical intervention.  Given that these liver cysts  can be tricky I recommend the patient to be seen by hepatobiliary surgery.  This seems to be a simple cyst but given his chronicity and increasing size she may require further workup in the form of MRI.  Especially given some effect on liver enzymes.  Sometimes cystadenomas can behave like this will require hepatobiliary surgery evaluation. I have had a discussion with the patient regarding my recommendation as well as with Dr. Cyril Loosen.  He will work on transferring the patient or seeking consultation from hepatobiliary surgery. His note that I spent 60 minutes in this encounter including personally reviewing imaging studies, coordinating care, placing orders and performing appropriate documentation Please also note that I physically saw this patient on 11/28 2023 at around 3 PM  Sterling Big, MD FACS General Surgeon 11/27/2022, 9:32 AM

## 2022-11-28 DIAGNOSIS — K311 Adult hypertrophic pyloric stenosis: Secondary | ICD-10-CM | POA: Diagnosis not present

## 2022-11-28 MED ORDER — DEXTROSE-NACL 5-0.45 % IV SOLN: INTRAVENOUS | Status: DC

## 2022-11-28 MED ORDER — HYDROMORPHONE HCL 1 MG/ML IJ SOLN
0.5000 mg | Freq: Once | INTRAMUSCULAR | Status: AC
  Administered 2022-11-28: 0.5 mg via INTRAVENOUS
  Filled 2022-11-28: qty 0.5

## 2022-11-28 MED ORDER — HYDROMORPHONE HCL 1 MG/ML IJ SOLN
1.0000 mg | INTRAMUSCULAR | Status: DC | PRN
  Administered 2022-11-28: 1 mg via INTRAVENOUS
  Filled 2022-11-28: qty 1

## 2022-11-28 NOTE — ED Notes (Signed)
[facility] and gave report to Adelina Mings, RN, primary RN for room [phone], at this time.

## 2022-11-28 NOTE — ED Notes (Signed)
Patient is currently leaving facility with [facility] at this time. Adelina Mings at [facility] called to update on ETA

## 2022-11-28 NOTE — ED Notes (Signed)
called to [facility] transfer for an update/still no bed assignment(pending) /rep:paul

## 2022-11-28 NOTE — ED Notes (Signed)
Assumed care of patient at this time. Report received form Crystal, Charity fundraiser.

## 2022-11-28 NOTE — ED Notes (Signed)
Patient informed to call out if pain or nausea return.

## 2022-11-28 NOTE — ED Notes (Signed)
Therapist, nutritional with [facility] and was added to the transportation list. I was informed we would receive a call when an ambulance crew would be heading our way. No current ETA.

## 2022-11-28 NOTE — ED Notes (Signed)
Received bed assignment for pt at [facility]

## 2022-11-28 NOTE — ED Notes (Signed)
Pt called out requesting pain medication.  

## 2023-04-16 HISTORY — PX: CHOLECYSTECTOMY, LAPAROSCOPIC: SHX56

## 2023-06-18 ENCOUNTER — Other Ambulatory Visit: Payer: Self-pay | Admitting: Internal Medicine

## 2023-06-18 DIAGNOSIS — Z1231 Encounter for screening mammogram for malignant neoplasm of breast: Secondary | ICD-10-CM

## 2023-07-18 ENCOUNTER — Ambulatory Visit
Admission: RE | Admit: 2023-07-18 | Discharge: 2023-07-18 | Disposition: A | Discharge status: Home / Self Care | Payer: Medicare Other | Source: Ambulatory Visit | Attending: Internal Medicine | Admitting: Internal Medicine

## 2023-07-18 DIAGNOSIS — Z1231 Encounter for screening mammogram for malignant neoplasm of breast: Secondary | ICD-10-CM | POA: Diagnosis present

## 2023-11-23 ENCOUNTER — Other Ambulatory Visit: Payer: Self-pay

## 2023-11-23 ENCOUNTER — Emergency Department
Admission: EM | Admit: 2023-11-23 | Discharge: 2023-11-23 | Disposition: A | Discharge status: Home / Self Care | Payer: Medicare Other | Attending: Emergency Medicine | Admitting: Emergency Medicine

## 2023-11-23 ENCOUNTER — Encounter: Payer: Self-pay | Admitting: Emergency Medicine

## 2023-11-23 ENCOUNTER — Emergency Department: Payer: Medicare Other

## 2023-11-23 DIAGNOSIS — S91311A Laceration without foreign body, right foot, initial encounter: Secondary | ICD-10-CM

## 2023-11-23 DIAGNOSIS — Z23 Encounter for immunization: Secondary | ICD-10-CM | POA: Insufficient documentation

## 2023-11-23 DIAGNOSIS — S91114A Laceration without foreign body of right lesser toe(s) without damage to nail, initial encounter: Secondary | ICD-10-CM | POA: Diagnosis not present

## 2023-11-23 DIAGNOSIS — W268XXA Contact with other sharp object(s), not elsewhere classified, initial encounter: Secondary | ICD-10-CM | POA: Diagnosis not present

## 2023-11-23 DIAGNOSIS — S91312A Laceration without foreign body, left foot, initial encounter: Secondary | ICD-10-CM | POA: Diagnosis not present

## 2023-11-23 MED ORDER — CEPHALEXIN 500 MG PO CAPS: 500.0000 mg | ORAL_CAPSULE | Freq: Three times a day (TID) | ORAL | 0 refills | Status: DC

## 2023-11-23 MED ORDER — LIDOCAINE HCL (PF) 1 % IJ SOLN
10.0000 mL | Freq: Once | INTRAMUSCULAR | Status: AC
  Administered 2023-11-23: 10 mL
  Filled 2023-11-23: qty 10

## 2023-11-23 MED ORDER — TETANUS-DIPHTH-ACELL PERTUSSIS 5-2.5-18.5 LF-MCG/0.5 IM SUSY
0.5000 mL | PREFILLED_SYRINGE | Freq: Once | INTRAMUSCULAR | Status: AC
  Administered 2023-11-23: 0.5 mL via INTRAMUSCULAR
  Filled 2023-11-23: qty 0.5

## 2023-11-23 NOTE — Discharge Instructions (Addendum)
Follow-up with your primary care provider or urgent care for suture removal in approximately 12 days.  Keep the area clean and dry.  You may clean it with mild soap and water but allowed to dry completely before covering it again.  Elevate your foot to reduce swelling and help with pain.  You may take Tylenol for this as needed.  An antibiotic was sent to your pharmacy to help prevent infection.

## 2023-11-23 NOTE — ED Notes (Signed)
See triage note  Presents with laceration to right foot  States she stepped on broke soap dish  Laceration under 2nd toe

## 2023-11-23 NOTE — ED Triage Notes (Signed)
Pt reports was taking a shower this am and has a ceramic soap dish that fell and broke and she stepped on it cutting her right foot 2nd toe. Bleeding controlled at this time.

## 2023-11-23 NOTE — ED Notes (Signed)
Foot placed in betadine soak

## 2023-11-23 NOTE — ED Provider Notes (Signed)
[facility] Provider Note    Event Date/Time   First MD Initiated Contact with Patient 11/23/23 1035     (approximate)   History   Laceration   HPI  Lauren White is a 77 y.o. female   presents to the ED with complaint of laceration to her left foot.  Patient states that she was in the shower when a ceramic soap dish fell cutting her foot.      Physical Exam   Triage Vital Signs: ED Triage Vitals  Encounter Vitals Group     BP 11/23/23 0906 117/60     Systolic BP Percentile --      Diastolic BP Percentile --      Pulse Rate 11/23/23 0906 77     Resp 11/23/23 0906 18     Temp 11/23/23 0906 98.2 F (36.8 C)     Temp Source 11/23/23 0906 Oral     SpO2 11/23/23 0906 100 %     Weight 11/23/23 0905 121 lb 4.1 oz (55 kg)     Height 11/23/23 0905 5' (1.524 m)     Head Circumference --      Peak Flow --      Pain Score 11/23/23 0906 0     Pain Loc --      Pain Education --      Exclude from Growth Chart --     Most recent vital signs: Vitals:   11/23/23 0906 11/23/23 1424  BP: 117/60 120/62  Pulse: 77 70  Resp: 18 18  Temp: 98.2 F (36.8 C) 98 F (36.7 C)  SpO2: 100% 99%     General: Awake, no distress.  Alert, talkative, answers questions appropriately.  With hearing. CV:  Good peripheral perfusion.  Resp:  Normal effort.  Abd:  No distention.  Other:  Foot with laceration on the plantar aspect of the first metatarsal area and across involving the webspace of the first and second digit and also to the second digit posteriorly.  Bleeding is controlled with direct pressure.  No obvious foreign body was noted on first exam.   ED Results / Procedures / Treatments   Labs (all labs ordered are listed, but only abnormal results are displayed) Labs Reviewed - No data to display   RADIOLOGY Right foot x-ray images reviewed by myself independent of the radiologist and was negative for opaque foreign  bodies.    PROCEDURES:  Critical Care performed:   Marland KitchenMarland KitchenLaceration Repair  Date/Time: 11/23/2023 2:03 PM  Performed by: Tommi Rumps, PA-C Authorized by: Tommi Rumps, PA-C   Consent:    Consent obtained:  Verbal   Consent given by:  Patient   Risks discussed:  Infection, pain and poor wound healing Universal protocol:    Patient identity confirmed:  Verbally with patient Anesthesia:    Anesthesia method:  Local infiltration   Local anesthetic:  Lidocaine 1% w/o epi Laceration details:    Location:  Foot   Foot location:  Sole of R foot   Length (cm):  2.5 Pre-procedure details:    Preparation:  Patient was prepped and draped in usual sterile fashion and imaging obtained to evaluate for foreign bodies Exploration:    Limited defect created (wound extended): no     Hemostasis achieved with:  Direct pressure   Contaminated: yes   Treatment:    Area cleansed with:  Saline   Amount of cleaning:  Extensive   Irrigation solution:  Sterile saline  Irrigation volume:  150 ml   Irrigation method:  Syringe   Visualized foreign bodies/material removed: yes   Skin repair:    Repair method:  Sutures   Suture size:  4-0   Suture material:  Prolene   Suture technique:  Simple interrupted   Number of sutures:  7 Approximation:    Approximation:  Close Repair type:    Repair type:  Simple Post-procedure details:    Dressing:  Non-adherent dressing   Procedure completion:  Tolerated Comments:     Contaminated with cat hair.  Marland Kitchen.Laceration Repair  Date/Time: 11/23/2023 2:07 PM  Performed by: Tommi Rumps, PA-C Authorized by: Tommi Rumps, PA-C   Consent:    Consent obtained:  Verbal   Consent given by:  Patient   Risks discussed:  Infection, pain and poor wound healing Universal protocol:    Patient identity confirmed:  Verbally with patient Anesthesia:    Anesthesia method:  Local infiltration   Local anesthetic:  Lidocaine 1% w/o epi Laceration  details:    Location:  Toe   Toe location:  R second toe   Length (cm):  1 Pre-procedure details:    Preparation:  Patient was prepped and draped in usual sterile fashion and imaging obtained to evaluate for foreign bodies Exploration:    Limited defect created (wound extended): no     Hemostasis achieved with:  Direct pressure   Imaging obtained: x-ray     Imaging outcome: foreign body not noted     Contaminated: yes   Treatment:    Area cleansed with:  Saline   Amount of cleaning:  Extensive   Irrigation solution:  Sterile saline   Irrigation volume:  100   Irrigation method:  Syringe   Visualized foreign bodies/material removed: yes   Skin repair:    Repair method:  Sutures   Suture size:  4-0   Suture material:  Prolene   Suture technique:  Simple interrupted   Number of sutures:  3 Approximation:    Approximation:  Loose Repair type:    Repair type:  Simple Post-procedure details:    Dressing:  Non-adherent dressing   Procedure completion:  Tolerated    MEDICATIONS ORDERED IN ED: Medications  lidocaine (PF) (XYLOCAINE) 1 % injection 10 mL (10 mLs Infiltration Given by Other 11/23/23 1400)  Tdap (BOOSTRIX) injection 0.5 mL (0.5 mLs Intramuscular Given 11/23/23 1417)     IMPRESSION / MDM / ASSESSMENT AND PLAN / ED COURSE  I reviewed the triage vital signs and the nursing notes.   Differential diagnosis includes, but is not limited to, laceration right foot, foreign body right foot, fracture, contusion, sprain.  77 year old female presents to the ED with a laceration to her right foot after stepping on a ceramic soap dish while she was in the shower.  Foot was soaked with saline as there was dried blood and cat hair.  Area was sutured and patient was made aware that because of the cat hair that a prescription for Keflex was being sent to the pharmacy as a preventative to keep this area from getting infected.  She is encouraged to elevate her foot to reduce swelling  and help with pain.  She is to follow-up with her PCP or urgent care to have sutures removed.      Patient's presentation is most consistent with acute complicated illness / injury requiring diagnostic workup.  FINAL CLINICAL IMPRESSION(S) / ED DIAGNOSES   Final diagnoses:  Laceration of plantar aspect of  right foot, initial encounter  Laceration of second toe of right foot, initial encounter     Rx / DC Orders   ED Discharge Orders          Ordered    cephALEXin (KEFLEX) 500 MG capsule  3 times daily        11/23/23 1359             Note:  This document was prepared using Dragon voice recognition software and may include unintentional dictation errors.   Tommi Rumps, PA-C 11/23/23 1519    Minna Antis, MD 11/24/23 1511

## 2023-11-30 DIAGNOSIS — N139 Obstructive and reflux uropathy, unspecified: Secondary | ICD-10-CM

## 2023-11-30 HISTORY — DX: Obstructive and reflux uropathy, unspecified: N13.9

## 2023-12-27 DIAGNOSIS — Z9049 Acquired absence of other specified parts of digestive tract: Secondary | ICD-10-CM

## 2023-12-27 DIAGNOSIS — E785 Hyperlipidemia, unspecified: Secondary | ICD-10-CM | POA: Diagnosis present

## 2023-12-27 DIAGNOSIS — Z87442 Personal history of urinary calculi: Secondary | ICD-10-CM

## 2023-12-27 DIAGNOSIS — F411 Generalized anxiety disorder: Secondary | ICD-10-CM | POA: Diagnosis present

## 2023-12-27 DIAGNOSIS — F32A Depression, unspecified: Secondary | ICD-10-CM | POA: Diagnosis present

## 2023-12-27 DIAGNOSIS — Z7982 Long term (current) use of aspirin: Secondary | ICD-10-CM

## 2023-12-27 DIAGNOSIS — K219 Gastro-esophageal reflux disease without esophagitis: Secondary | ICD-10-CM | POA: Diagnosis present

## 2023-12-27 DIAGNOSIS — N136 Pyonephrosis: Principal | ICD-10-CM | POA: Diagnosis present

## 2023-12-27 DIAGNOSIS — E1151 Type 2 diabetes mellitus with diabetic peripheral angiopathy without gangrene: Secondary | ICD-10-CM | POA: Diagnosis present

## 2023-12-27 DIAGNOSIS — H9190 Unspecified hearing loss, unspecified ear: Secondary | ICD-10-CM | POA: Diagnosis present

## 2023-12-27 DIAGNOSIS — B952 Enterococcus as the cause of diseases classified elsewhere: Secondary | ICD-10-CM | POA: Diagnosis present

## 2023-12-27 DIAGNOSIS — M1990 Unspecified osteoarthritis, unspecified site: Secondary | ICD-10-CM | POA: Diagnosis present

## 2023-12-27 DIAGNOSIS — N200 Calculus of kidney: Secondary | ICD-10-CM | POA: Diagnosis not present

## 2023-12-27 DIAGNOSIS — Z87440 Personal history of urinary (tract) infections: Secondary | ICD-10-CM

## 2023-12-27 DIAGNOSIS — Z888 Allergy status to other drugs, medicaments and biological substances status: Secondary | ICD-10-CM

## 2023-12-27 DIAGNOSIS — Z87891 Personal history of nicotine dependence: Secondary | ICD-10-CM

## 2023-12-27 DIAGNOSIS — Z90710 Acquired absence of both cervix and uterus: Secondary | ICD-10-CM

## 2023-12-27 DIAGNOSIS — E039 Hypothyroidism, unspecified: Secondary | ICD-10-CM | POA: Diagnosis present

## 2023-12-27 DIAGNOSIS — Z9109 Other allergy status, other than to drugs and biological substances: Secondary | ICD-10-CM

## 2023-12-27 DIAGNOSIS — Z79890 Hormone replacement therapy (postmenopausal): Secondary | ICD-10-CM

## 2023-12-27 DIAGNOSIS — Z79899 Other long term (current) drug therapy: Secondary | ICD-10-CM

## 2023-12-27 DIAGNOSIS — N3289 Other specified disorders of bladder: Secondary | ICD-10-CM | POA: Diagnosis present

## 2023-12-27 DIAGNOSIS — Z883 Allergy status to other anti-infective agents status: Secondary | ICD-10-CM

## 2023-12-27 NOTE — ED Triage Notes (Addendum)
Pt to ED via [facility] c/o Left flank pain x1 day. Pt being treated for bladder infection. First antibiotic was started Tuesday but were changed yesterday. Pain sometimes radiates to LLQ. No abd pain at this time. Denies fevers, cp, shob, dizziness.

## 2023-12-28 ENCOUNTER — Inpatient Hospital Stay: Payer: Medicare Other | Admitting: Anesthesiology

## 2023-12-28 ENCOUNTER — Inpatient Hospital Stay
Admission: EM | Admit: 2023-12-28 | Discharge: 2023-12-30 | DRG: 661 | Disposition: A | Discharge status: Home / Self Care | Payer: Medicare Other | Attending: Internal Medicine | Admitting: Internal Medicine

## 2023-12-28 ENCOUNTER — Emergency Department: Payer: Medicare Other

## 2023-12-28 ENCOUNTER — Inpatient Hospital Stay: Payer: Medicare Other

## 2023-12-28 ENCOUNTER — Other Ambulatory Visit: Payer: Self-pay

## 2023-12-28 ENCOUNTER — Encounter
Admission: EM | Disposition: A | Discharge status: Home / Self Care | Payer: Self-pay | Source: Home / Self Care | Attending: Internal Medicine

## 2023-12-28 DIAGNOSIS — N201 Calculus of ureter: Secondary | ICD-10-CM | POA: Diagnosis not present

## 2023-12-28 DIAGNOSIS — N3289 Other specified disorders of bladder: Secondary | ICD-10-CM | POA: Diagnosis present

## 2023-12-28 DIAGNOSIS — N200 Calculus of kidney: Secondary | ICD-10-CM | POA: Diagnosis present

## 2023-12-28 DIAGNOSIS — Z883 Allergy status to other anti-infective agents status: Secondary | ICD-10-CM | POA: Diagnosis not present

## 2023-12-28 DIAGNOSIS — Z87442 Personal history of urinary calculi: Secondary | ICD-10-CM | POA: Diagnosis not present

## 2023-12-28 DIAGNOSIS — E785 Hyperlipidemia, unspecified: Secondary | ICD-10-CM | POA: Diagnosis present

## 2023-12-28 DIAGNOSIS — N390 Urinary tract infection, site not specified: Secondary | ICD-10-CM | POA: Diagnosis not present

## 2023-12-28 DIAGNOSIS — Z79890 Hormone replacement therapy (postmenopausal): Secondary | ICD-10-CM | POA: Diagnosis not present

## 2023-12-28 DIAGNOSIS — E039 Hypothyroidism, unspecified: Secondary | ICD-10-CM | POA: Diagnosis present

## 2023-12-28 DIAGNOSIS — K219 Gastro-esophageal reflux disease without esophagitis: Secondary | ICD-10-CM | POA: Diagnosis present

## 2023-12-28 DIAGNOSIS — N139 Obstructive and reflux uropathy, unspecified: Secondary | ICD-10-CM | POA: Diagnosis not present

## 2023-12-28 DIAGNOSIS — Z888 Allergy status to other drugs, medicaments and biological substances status: Secondary | ICD-10-CM | POA: Diagnosis not present

## 2023-12-28 DIAGNOSIS — N1330 Unspecified hydronephrosis: Secondary | ICD-10-CM | POA: Diagnosis not present

## 2023-12-28 DIAGNOSIS — F411 Generalized anxiety disorder: Secondary | ICD-10-CM | POA: Diagnosis present

## 2023-12-28 DIAGNOSIS — Z90710 Acquired absence of both cervix and uterus: Secondary | ICD-10-CM | POA: Diagnosis not present

## 2023-12-28 DIAGNOSIS — D72829 Elevated white blood cell count, unspecified: Secondary | ICD-10-CM | POA: Diagnosis not present

## 2023-12-28 DIAGNOSIS — N136 Pyonephrosis: Secondary | ICD-10-CM | POA: Diagnosis present

## 2023-12-28 DIAGNOSIS — E1151 Type 2 diabetes mellitus with diabetic peripheral angiopathy without gangrene: Secondary | ICD-10-CM | POA: Diagnosis present

## 2023-12-28 DIAGNOSIS — H9190 Unspecified hearing loss, unspecified ear: Secondary | ICD-10-CM

## 2023-12-28 DIAGNOSIS — Z87440 Personal history of urinary (tract) infections: Secondary | ICD-10-CM | POA: Diagnosis not present

## 2023-12-28 DIAGNOSIS — N10 Acute tubulo-interstitial nephritis: Secondary | ICD-10-CM | POA: Diagnosis not present

## 2023-12-28 DIAGNOSIS — Z87891 Personal history of nicotine dependence: Secondary | ICD-10-CM | POA: Diagnosis not present

## 2023-12-28 DIAGNOSIS — Z79899 Other long term (current) drug therapy: Secondary | ICD-10-CM | POA: Diagnosis not present

## 2023-12-28 DIAGNOSIS — B952 Enterococcus as the cause of diseases classified elsewhere: Secondary | ICD-10-CM | POA: Diagnosis present

## 2023-12-28 DIAGNOSIS — M1990 Unspecified osteoarthritis, unspecified site: Secondary | ICD-10-CM | POA: Diagnosis present

## 2023-12-28 DIAGNOSIS — Z7982 Long term (current) use of aspirin: Secondary | ICD-10-CM | POA: Diagnosis not present

## 2023-12-28 DIAGNOSIS — F32A Depression, unspecified: Secondary | ICD-10-CM | POA: Diagnosis present

## 2023-12-28 DIAGNOSIS — Z9049 Acquired absence of other specified parts of digestive tract: Secondary | ICD-10-CM | POA: Diagnosis not present

## 2023-12-28 DIAGNOSIS — E119 Type 2 diabetes mellitus without complications: Secondary | ICD-10-CM

## 2023-12-28 DIAGNOSIS — Z9109 Other allergy status, other than to drugs and biological substances: Secondary | ICD-10-CM | POA: Diagnosis not present

## 2023-12-28 HISTORY — PX: CYSTOSCOPY W/ URETERAL STENT PLACEMENT: SHX1429

## 2023-12-28 LAB — URINALYSIS, ROUTINE W REFLEX MICROSCOPIC
Bilirubin Urine: NEGATIVE
Glucose, UA: 50 mg/dL — AB
Ketones, ur: NEGATIVE mg/dL
Nitrite: NEGATIVE
Protein, ur: 30 mg/dL — AB
RBC / HPF: >50 RBC/hpf (ref 0–5)
Specific Gravity, Urine: 1.01 (ref 1.005–1.030)
Squamous Epithelial / HPF: 0 /[HPF] (ref 0–5)
WBC, UA: >50 WBC/hpf (ref 0–5)
pH: 8 (ref 5.0–8.0)

## 2023-12-28 LAB — COMPREHENSIVE METABOLIC PANEL
ALT: 18 U/L (ref 0–44)
AST: 31 U/L (ref 15–41)
Albumin: 4 g/dL (ref 3.5–5.0)
Alkaline Phosphatase: 200 U/L — ABNORMAL HIGH (ref 38–126)
Anion gap: 15 (ref 5–15)
BUN: 14 mg/dL (ref 8–23)
CO2: 24 mmol/L (ref 22–32)
Calcium: 9.1 mg/dL (ref 8.9–10.3)
Chloride: 95 mmol/L — ABNORMAL LOW (ref 98–111)
Creatinine, Ser: 1.03 mg/dL — ABNORMAL HIGH (ref 0.44–1.00)
GFR, Estimated: 56 mL/min — ABNORMAL LOW (ref >60)
Glucose, Bld: 198 mg/dL — ABNORMAL HIGH (ref 70–99)
Potassium: 3.6 mmol/L (ref 3.5–5.1)
Sodium: 134 mmol/L — ABNORMAL LOW (ref 135–145)
Total Bilirubin: 0.7 mg/dL (ref <1.2)
Total Protein: 7.6 g/dL (ref 6.5–8.1)

## 2023-12-28 LAB — CBC
HCT: 39.1 % (ref 36.0–46.0)
Hemoglobin: 13 g/dL (ref 12.0–15.0)
MCH: 28.9 pg (ref 26.0–34.0)
MCHC: 33.2 g/dL (ref 30.0–36.0)
MCV: 86.9 fL (ref 80.0–100.0)
Platelets: 267 10*3/uL (ref 150–400)
RBC: 4.5 MIL/uL (ref 3.87–5.11)
RDW: 13.4 % (ref 11.5–15.5)
WBC: 17.9 10*3/uL — ABNORMAL HIGH (ref 4.0–10.5)
nRBC: 0 % (ref 0.0–0.2)

## 2023-12-28 LAB — GLUCOSE, CAPILLARY: Glucose-Capillary: 148 mg/dL — ABNORMAL HIGH (ref 70–99)

## 2023-12-28 LAB — CBG MONITORING, ED
Glucose-Capillary: 145 mg/dL — ABNORMAL HIGH (ref 70–99)
Glucose-Capillary: 115 mg/dL — ABNORMAL HIGH (ref 70–99)
Glucose-Capillary: 106 mg/dL — ABNORMAL HIGH (ref 70–99)

## 2023-12-28 LAB — LIPASE, BLOOD: Lipase: 34 U/L (ref 11–51)

## 2023-12-28 SURGERY — CYSTOSCOPY, FLEXIBLE, WITH STENT REPLACEMENT
Anesthesia: General | Site: Ureter | Laterality: Bilateral

## 2023-12-28 MED ORDER — SODIUM CHLORIDE 0.9 % IV SOLN: INTRAVENOUS | Status: DC

## 2023-12-28 MED ORDER — IOHEXOL 180 MG/ML  SOLN
INTRAMUSCULAR | Status: DC | PRN
  Administered 2023-12-28: 25 mL

## 2023-12-28 MED ORDER — PHENYLEPHRINE 80 MCG/ML (10ML) SYRINGE FOR IV PUSH (FOR BLOOD PRESSURE SUPPORT)
PREFILLED_SYRINGE | INTRAVENOUS | Status: DC | PRN
  Administered 2023-12-28 (×5): 80 ug via INTRAVENOUS

## 2023-12-28 MED ORDER — LACTATED RINGERS IV SOLN: INTRAVENOUS | Status: DC | PRN

## 2023-12-28 MED ORDER — LEVOTHYROXINE SODIUM 50 MCG PO TABS
75.0000 ug | ORAL_TABLET | Freq: Every day | ORAL | Status: DC
  Administered 2023-12-29 – 2023-12-30 (×2): 75 ug via ORAL
  Filled 2023-12-28 (×2): qty 2

## 2023-12-28 MED ORDER — ACETAMINOPHEN 325 MG PO TABS
650.0000 mg | ORAL_TABLET | Freq: Four times a day (QID) | ORAL | Status: DC | PRN
Start: 2023-12-28 — End: 2023-12-30

## 2023-12-28 MED ORDER — ONDANSETRON HCL 4 MG PO TABS: 4.0000 mg | ORAL_TABLET | Freq: Four times a day (QID) | ORAL | Status: DC | PRN

## 2023-12-28 MED ORDER — FENTANYL CITRATE PF 50 MCG/ML IJ SOSY
50.0000 ug | PREFILLED_SYRINGE | Freq: Once | INTRAMUSCULAR | Status: AC
  Administered 2023-12-28: 50 ug via INTRAVENOUS
  Filled 2023-12-28: qty 1

## 2023-12-28 MED ORDER — ONDANSETRON HCL 4 MG/2ML IJ SOLN: 4.0000 mg | Freq: Four times a day (QID) | INTRAMUSCULAR | Status: DC | PRN

## 2023-12-28 MED ORDER — SODIUM CHLORIDE 0.9 % IV SOLN
3.0000 g | Freq: Two times a day (BID) | INTRAVENOUS | Status: DC
  Administered 2023-12-29: 3 g via INTRAVENOUS
  Filled 2023-12-28: qty 8

## 2023-12-28 MED ORDER — FENTANYL CITRATE (PF) 100 MCG/2ML IJ SOLN: 25.0000 ug | INTRAMUSCULAR | Status: DC | PRN

## 2023-12-28 MED ORDER — FENTANYL CITRATE (PF) 100 MCG/2ML IJ SOLN
INTRAMUSCULAR | Status: AC
  Filled 2023-12-28: qty 2

## 2023-12-28 MED ORDER — ONDANSETRON HCL 4 MG/2ML IJ SOLN
INTRAMUSCULAR | Status: DC | PRN
  Administered 2023-12-28: 4 mg via INTRAVENOUS

## 2023-12-28 MED ORDER — SODIUM CHLORIDE 0.9 % IR SOLN
Status: DC | PRN
  Administered 2023-12-28: 3000 mL

## 2023-12-28 MED ORDER — FENTANYL CITRATE (PF) 100 MCG/2ML IJ SOLN
INTRAMUSCULAR | Status: DC | PRN
  Administered 2023-12-28: 50 ug via INTRAVENOUS

## 2023-12-28 MED ORDER — OXYCODONE HCL 5 MG PO TABS: 5.0000 mg | ORAL_TABLET | Freq: Once | ORAL | Status: DC | PRN

## 2023-12-28 MED ORDER — DROPERIDOL 2.5 MG/ML IJ SOLN: 0.6250 mg | Freq: Once | INTRAMUSCULAR | Status: DC | PRN

## 2023-12-28 MED ORDER — SODIUM CHLORIDE 0.9 % IV SOLN: 2.0000 g | INTRAVENOUS | Status: DC

## 2023-12-28 MED ORDER — HYDROMORPHONE HCL 1 MG/ML IJ SOLN
0.5000 mg | Freq: Once | INTRAMUSCULAR | Status: AC
  Administered 2023-12-28: 0.5 mg via INTRAVENOUS
  Filled 2023-12-28: qty 0.5

## 2023-12-28 MED ORDER — CEFTRIAXONE SODIUM 1 G IJ SOLR
1.0000 g | Freq: Once | INTRAMUSCULAR | Status: AC
  Administered 2023-12-28: 1 g via INTRAVENOUS
  Filled 2023-12-28: qty 10

## 2023-12-28 MED ORDER — OXYBUTYNIN CHLORIDE 5 MG PO TABS: 2.5000 mg | ORAL_TABLET | Freq: Three times a day (TID) | ORAL | Status: DC | PRN

## 2023-12-28 MED ORDER — ROCURONIUM BROMIDE 100 MG/10ML IV SOLN
INTRAVENOUS | Status: DC | PRN
  Administered 2023-12-28: 30 mg via INTRAVENOUS

## 2023-12-28 MED ORDER — ASPIRIN 81 MG PO CHEW
81.0000 mg | CHEWABLE_TABLET | Freq: Every day | ORAL | Status: DC
  Administered 2023-12-28 – 2023-12-30 (×3): 81 mg via ORAL
  Filled 2023-12-28 (×3): qty 1

## 2023-12-28 MED ORDER — ATORVASTATIN CALCIUM 20 MG PO TABS
20.0000 mg | ORAL_TABLET | Freq: Every evening | ORAL | Status: DC
  Administered 2023-12-29: 20 mg via ORAL
  Filled 2023-12-28 (×2): qty 1

## 2023-12-28 MED ORDER — OXYCODONE HCL 5 MG/5ML PO SOLN: 5.0000 mg | Freq: Once | ORAL | Status: DC | PRN

## 2023-12-28 MED ORDER — ACETAMINOPHEN 10 MG/ML IV SOLN: 1000.0000 mg | Freq: Once | INTRAVENOUS | Status: DC | PRN

## 2023-12-28 MED ORDER — PROPOFOL 10 MG/ML IV BOLUS
INTRAVENOUS | Status: DC | PRN
  Administered 2023-12-28: 100 mg via INTRAVENOUS

## 2023-12-28 MED ORDER — SODIUM CHLORIDE 0.9 % IV SOLN
1.0000 g | Freq: Once | INTRAVENOUS | Status: AC
  Administered 2023-12-28: 1 g via INTRAVENOUS
  Filled 2023-12-28: qty 10

## 2023-12-28 MED ORDER — LIDOCAINE HCL (CARDIAC) PF 100 MG/5ML IV SOSY
PREFILLED_SYRINGE | INTRAVENOUS | Status: DC | PRN
  Administered 2023-12-28: 50 mg via INTRAVENOUS

## 2023-12-28 MED ORDER — INSULIN ASPART 100 UNIT/ML IJ SOLN
0.0000 [IU] | Freq: Three times a day (TID) | INTRAMUSCULAR | Status: DC
  Administered 2023-12-28: 1 [IU] via SUBCUTANEOUS
  Administered 2023-12-29: 2 [IU] via SUBCUTANEOUS
  Filled 2023-12-28 (×2): qty 1

## 2023-12-28 MED ORDER — DEXAMETHASONE SODIUM PHOSPHATE 10 MG/ML IJ SOLN
INTRAMUSCULAR | Status: DC | PRN
  Administered 2023-12-28: 10 mg via INTRAVENOUS

## 2023-12-28 MED ORDER — HYDROMORPHONE HCL 1 MG/ML IJ SOLN
0.5000 mg | INTRAMUSCULAR | Status: DC | PRN
  Administered 2023-12-28 (×2): 0.5 mg via INTRAVENOUS
  Filled 2023-12-28 (×2): qty 0.5

## 2023-12-28 MED ORDER — SUGAMMADEX SODIUM 200 MG/2ML IV SOLN
INTRAVENOUS | Status: DC | PRN
  Administered 2023-12-28: 200 mg via INTRAVENOUS

## 2023-12-28 MED ORDER — ENOXAPARIN SODIUM 40 MG/0.4ML IJ SOSY
40.0000 mg | PREFILLED_SYRINGE | INTRAMUSCULAR | Status: DC
  Administered 2023-12-28 – 2023-12-29 (×2): 40 mg via SUBCUTANEOUS
  Filled 2023-12-28 (×2): qty 0.4

## 2023-12-28 MED ORDER — MIDAZOLAM HCL 2 MG/2ML IJ SOLN
INTRAMUSCULAR | Status: AC
  Filled 2023-12-28: qty 2

## 2023-12-28 SURGICAL SUPPLY — 17 items
BAG DRAIN SIEMENS DORNER NS (MISCELLANEOUS) ×1 IMPLANT
BRUSH SCRUB EZ 1% IODOPHOR (MISCELLANEOUS) ×1 IMPLANT
CATH URETL OPEN END 6X70 (CATHETERS) ×1 IMPLANT
GLOVE BIOGEL PI IND STRL 7.5 (GLOVE) ×1 IMPLANT
GOWN STRL REUS W/ TWL XL LVL3 (GOWN DISPOSABLE) ×1 IMPLANT
GUIDEWIRE STR DUAL SENSOR (WIRE) ×1 IMPLANT
IV NS IRRIG 3000ML ARTHROMATIC (IV SOLUTION) ×1 IMPLANT
KIT TURNOVER CYSTO (KITS) ×1 IMPLANT
PACK CYSTO AR (MISCELLANEOUS) ×1 IMPLANT
SET CYSTO W/LG BORE CLAMP LF (SET/KITS/TRAYS/PACK) ×1 IMPLANT
STENT URET 6FRX22 CONTOUR (STENTS) IMPLANT
STENT URET 6FRX24 CONTOUR (STENTS) IMPLANT
STENT URET 6FRX26 CONTOUR (STENTS) IMPLANT
STENT URETL SOFT 5X20 (MISCELLANEOUS) IMPLANT
SURGILUBE 2OZ TUBE FLIPTOP (MISCELLANEOUS) ×1 IMPLANT
WATER STERILE IRR 1000ML POUR (IV SOLUTION) ×1 IMPLANT
WATER STERILE IRR 500ML POUR (IV SOLUTION) ×1 IMPLANT

## 2023-12-28 NOTE — Assessment & Plan Note (Signed)
 Cont synthroid

## 2023-12-28 NOTE — ED Notes (Signed)
Pt given gown and belongings bag to change, daughter at bedside to help pt change

## 2023-12-28 NOTE — Anesthesia Preprocedure Evaluation (Addendum)
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  The ECA appears <50% stenosed.   Left Carotid: Velocities in the left ICA are consistent with a 1-39% stenosis.  The ECA appears <50% stenosed.     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 No date: Depression No date: Dysrhythmia     Comment:  HISTORY OF SVT, performed a cardiac ablation 04/07/2021: Hematuria No date: History of kidney stones No date: Hyperlipidemia No date: Hypothyroidism 08/24/2015: Personal history of tobacco use, presenting hazards to  health No date: Pre-diabetes Past Surgical History: No date: ABDOMINAL HYSTERECTOMY No date: APPENDECTOMY No date: CARDIAC ELECTROPHYSIOLOGY MAPPING AND ABLATION 03/13/2021: EYE SURGERY; Left     Comment:  cataract and vitrectomy BMI     Body Mass Index: 23.44 kg/m     Reproductive/Obstetrics                             Anesthesia Physical Anesthesia Plan  ASA: 2  Anesthesia Plan: General   Post-op Pain Management:    Induction: Intravenous  PONV Risk Score and Plan: Ondansetron, Treatment may vary due to age or medical condition and Dexamethasone  Airway Management Planned: Oral ETT  Additional Equipment:   Intra-op Plan:   Post-operative Plan: Extubation in OR  Informed Consent: I have reviewed the patients History and Physical, chart, labs and discussed the procedure including the risks, benefits and alternatives for the proposed anesthesia with the patient or authorized representative who has indicated his/her understanding and acceptance.     Dental Advisory Given  Plan Discussed with: CRNA  Anesthesia Plan Comments:         Anesthesia Quick Evaluation

## 2023-12-28 NOTE — Progress Notes (Signed)
Noted 12/24 urine culture in care everywhere w/ pansensitive E. Faecalis  Will transition IV abx to unasyn  Follow closely    [facility] Final report Abnormal Result 1 - [facility] Enterococcus faecalis Abnormal For Enterococcus species, aminoglycosides (except for high-level resistance screening), cephalosporins, clindamycin, and trimethoprim-sulfamethoxazole are not effective clinically. (CLSI, M100-S26, 2016) Greater than 100,000 colony forming units per mLAntimicrobial Susceptibility - [facility] Comment      ** S = Susceptible; I = Intermediate; R = Resistant **                    P = Positive; N = Negative             MICS are expressed in micrograms per mL    Antibiotic                 RSLT#1    RSLT#2    RSLT#3    RSLT#4 Ciprofloxacin                  S Levofloxacin                   S Nitrofurantoin                 S Penicillin                     S Tetracycline                   S Vancomycin                     SResulting AgencyKERNODLE [facility]

## 2023-12-28 NOTE — Progress Notes (Signed)
Pharmacy Antibiotic Note  Lauren White is a 77 y.o. female admitted on 12/28/2023 with UTI.  Patient presented with 5-6 days of progressive flan pain. Was evaluated by PCP initially and was placed on oral antibiotics, cephalexin. Symptoms still persisted despite treatment. Urine culture from 12/24 showed pansensitive E. faecalis. Patient originally started on ceftriaxone on presentation to ED, now MD is transitioning to Unasyn. Pharmacy has been consulted for Unasyn dosing.  Plan: Start Unasyn 3 g IV q12h Continue to monitor renal function and follow culture results   Height: 5' (152.4 cm) Weight: 53.5 kg (118 lb) IBW/kg (Calculated) : 45.5  Temp (24hrs), Avg:98.7 F (37.1 C), Min:98.5 F (36.9 C), Max:99.1 F (37.3 C)  Recent Labs  Lab 12/28/23 0004  WBC 17.9*  CREATININE 1.03*    Estimated Creatinine Clearance: 32.3 mL/min (A) (by C-G formula based on SCr of 1.03 mg/dL (H)).    Allergies  Allergen Reactions   Diphenhydramine Hcl Swelling    Increases pressure in the eyes due to glaucoma   Etodolac Other (See Comments)    Chest pain   Omeprazole Other (See Comments)    Chest pain   Tramadol Other (See Comments)    Difficulty breathing   Nickel Rash   Povidone-Iodine Rash    Antimicrobials this admission: 12/29 Ceftriaxone x 2 doses  12/29 Unasyn >>   Dose adjustments this admission: None  Microbiology results: 12/29 BCx: IP 12/29 UCx: IP    Thank you for allowing pharmacy to be a part of this patients care.  Merryl Hacker, PharmD Clinical  Pharmacist 12/28/2023 12:40 PM

## 2023-12-28 NOTE — Anesthesia Postprocedure Evaluation (Signed)
Anesthesia Post Note  Patient: Lauren White  Procedure(s) Performed: CYSTOSCOPY WITH STENT PLACEMENT (Bilateral: Ureter)  Patient location during evaluation: PACU Anesthesia Type: General Level of consciousness: awake and alert Pain management: pain level controlled Vital Signs Assessment: post-procedure vital signs reviewed and stable Respiratory status: spontaneous breathing, nonlabored ventilation and respiratory function stable Cardiovascular status: blood pressure returned to baseline and stable Postop Assessment: no apparent nausea or vomiting Anesthetic complications: no   No notable events documented.   Last Vitals:  Vitals:   12/28/23 2025 12/28/23 2030  BP:    Pulse: 89 88  Resp: 17 18  Temp: 36.7 C   SpO2: 93% 90%    Last Pain:  Vitals:   12/28/23 2030  TempSrc:   PainSc: 0-No pain                 Foye Deer

## 2023-12-28 NOTE — Assessment & Plan Note (Signed)
WBC 18 on presentation  Likely secondary to pyelonephritis and bilateral obstructive uropathy  Blood and urine cultures drawn  Trend WBC w/ treatment

## 2023-12-28 NOTE — Assessment & Plan Note (Signed)
 Blood sugar in 190s SSI Monitor

## 2023-12-28 NOTE — Transfer of Care (Signed)
Immediate Anesthesia Transfer of Care Note  Patient: Lauren White  Procedure(s) Performed: CYSTOSCOPY WITH STENT REPLACEMENT (Bilateral: Ureter)  Patient Location: PACU  Anesthesia Type:General  Level of Consciousness: drowsy  Airway & Oxygen Therapy: Patient Spontanous Breathing and Patient connected to face mask oxygen  Post-op Assessment: Report given to RN and Post -op Vital signs reviewed and stable  Post vital signs: Reviewed and stable  Last Vitals:  Vitals Value Taken Time  BP 158/70   Temp    Pulse 83   Resp 16   SpO2 100     Last Pain:  Vitals:   12/28/23 1813  TempSrc:   PainSc: 3          Complications: No notable events documented.

## 2023-12-28 NOTE — Assessment & Plan Note (Signed)
Noted pyelonephritis w/ bilateral obstructive uropathy noted on CT imaging  Started on IV rocephin for infectious coverage  Dr. Lonna Cobb w/ urology made aware of case by Dr. Fuller Plan Urine culture  Follow up urology recommendations  Pain control  Antiemetics  IVF hydration  Monitor

## 2023-12-28 NOTE — ED Provider Notes (Signed)
[facility] Provider Note    Event Date/Time   First MD Initiated Contact with Patient 12/28/23 [phone]     (approximate)  History   Chief Complaint: Flank Pain  HPI  Lauren White is a 77 y.o. female with a past medical history of anxiety, hyperlipidemia, presents to the emergency department for left flank pain.  According to the patient for the past week or so she has been experiencing pain in her left flank.  Patient states her doctor called her in an antibiotic but she does not believe it is helping.  Continues to have left flank pain nausea had an episode of vomiting earlier today.  Patient states a history of urinary tract infections in the past, also states a history of kidney stones.  Denies any fever.  Physical Exam   Triage Vital Signs: ED Triage Vitals  Encounter Vitals Group     BP 12/28/23 0001 (!) 194/100     Systolic BP Percentile --      Diastolic BP Percentile --      Pulse Rate 12/28/23 0001 83     Resp 12/28/23 0001 18     Temp 12/28/23 0001 98.6 F (37 C)     Temp Source 12/28/23 0001 Oral     SpO2 12/28/23 0001 99 %     Weight 12/28/23 0000 118 lb (53.5 kg)     Height 12/28/23 0000 5' (1.524 m)     Head Circumference --      Peak Flow --      Pain Score 12/27/23 2359 7     Pain Loc --      Pain Education --      Exclude from Growth Chart --     Most recent vital signs: Vitals:   12/28/23 0001 12/28/23 0332  BP: (!) 194/100 (!) 188/78  Pulse: 83 82  Resp: 18 17  Temp: 98.6 F (37 C) 98.5 F (36.9 C)  SpO2: 99% 98%    General: Awake, no distress.  CV:  Good peripheral perfusion.  Regular rate and rhythm  Resp:  Normal effort.  Equal breath sounds bilaterally.  Abd:  No distention.  Soft, benign abdomen.  ED Results / Procedures / Treatments   RADIOLOGY  I have reviewed and interpreted CT images.  Patient has a possible left UVJ stone awaiting radiology read.   MEDICATIONS ORDERED IN ED: Medications   cefTRIAXone (ROCEPHIN) 1 g in sodium chloride 0.9 % 100 mL IVPB (has no administration in time range)  fentaNYL (SUBLIMAZE) injection 50 mcg (has no administration in time range)     IMPRESSION / MDM / ASSESSMENT AND PLAN / ED COURSE  I reviewed the triage vital signs and the nursing notes.  Patient's presentation is most consistent with acute presentation with potential threat to life or bodily function.  Patient presents to the emergency department for left flank pain ongoing over the past 1 week or so getting worse per patient.  Patient has a history of kidney stones as well as urinary tract infections.  Patient has completed a course of cephalexin by her doctor has not improved so she came to the emergency department for evaluation.  Patient's lab work has resulted showing significant leukocytosis of 17,900, chemistry shows no significant finding, normal renal function.  Borderline elevated anion gap of 15 we will IV hydrate.  LFTs and lipase are reassuring.  Urinalysis does show greater than 50 red cells white cells as well as rare bacteria.  Given the patient's leukocytosis will cover with IV antibiotics.  Will obtain a CT renal scan to further evaluate.  Patient agreeable to plan of care.  CT scan shows multiple nonobstructive calculi within the collecting system of both kidneys several large calculi in the distal third of both ureters at and before the UVJ resulting in severe right and moderate left hydroureteronephrosis.  I have sent a message to urology.  Will continue with IV antibiotics and admit to the hospitalist service.  FINAL CLINICAL IMPRESSION(S) / ED DIAGNOSES   Left flank pain Kidney stone Urinary tract infection   Note:  This document was prepared using Dragon voice recognition software and may include unintentional dictation errors.   Minna Antis, MD 12/28/23 [phone]

## 2023-12-28 NOTE — Assessment & Plan Note (Signed)
PPI ?

## 2023-12-28 NOTE — H&P (Signed)
History and Physical    Patient: Lauren White ZOX:[id number] DOB: 01-May-1946 DOA: 12/28/2023 DOS: the patient was seen and examined on 12/28/2023 PCP: Gracelyn Nurse, MD  Patient coming from: Home  Chief Complaint:  Chief Complaint  Patient presents with   Flank Pain   HPI: Lauren White is a 77 y.o. female with medical history significant of hypothyroidism, HLD, hx/o kidney stones presenting w/ pyelonephritis, obstructive uropathy, nephrolithiasis. Pt reports 5-6 days of progressive flank pain. Was evaluated by PCP w/ initial concern for UTI. Was placed on course of oral abx. Sxs still persisted despite treatment. No CP or SOB. No reported nausea or vomiting. Reports prior history of kidney stones in 2022. Mild dysuria. + nausea. No CP or SOB. + mid- low back pain. No focal hemiparesis or confusion. Pt no longer smoking.  Presented to ER afebrile, hemodynamically stable.  WBC 18, hgb 13, plt 267, Cr 1.03. Glu 190s.  Review of Systems: As mentioned in the history of present illness. All other systems reviewed and are negative. Past Medical History:  Diagnosis Date   Anxiety    Arthritis    Borderline glaucoma    Depression    Dysrhythmia    HISTORY OF SVT, performed a cardiac ablation   Hematuria 04/07/2021   History of kidney stones    Hyperlipidemia    Hypothyroidism    Personal history of tobacco use, presenting hazards to health 08/24/2015   Pre-diabetes    Past Surgical History:  Procedure Laterality Date   ABDOMINAL HYSTERECTOMY     APPENDECTOMY     CARDIAC ELECTROPHYSIOLOGY MAPPING AND ABLATION     CYSTOSCOPY W/ RETROGRADES Bilateral 04/12/2021   Procedure: CYSTOSCOPY WITH RETROGRADE PYELOGRAM;  Surgeon: Vanna Scotland, MD;  Location: [facility];  Service: Urology;  Laterality: Bilateral;   CYSTOSCOPY/URETEROSCOPY/HOLMIUM LASER/STENT PLACEMENT Left 04/12/2021   Procedure: CYSTOSCOPY/URETEROSCOPY/HOLMIUM LASER/STENT PLACEMENT;  Surgeon: Vanna Scotland,  MD;  Location: [facility];  Service: Urology;  Laterality: Left;   EYE SURGERY Left 03/13/2021   cataract and vitrectomy   URETEROSCOPY Right 04/12/2021   Procedure: URETEROSCOPY;  Surgeon: Vanna Scotland, MD;  Location: [facility] OR[facility];  Service: Urology;  Laterality: Right;   Social History:  reports that she quit smoking about 19 years ago. Her smoking use included cigarettes. She started smoking about 59 years ago. She has a 40 pack-year smoking history. She has never used smokeless tobacco. She reports that she does not drink alcohol and does not use drugs.  Allergies  Allergen Reactions   Diphenhydramine Hcl Swelling    Increases pressure in the eyes due to glaucoma   Etodolac Other (See Comments)    Chest pain   Omeprazole Other (See Comments)    Chest pain   Tramadol Other (See Comments)    Difficulty breathing   Nickel Rash   Povidone-Iodine Rash    Family History  Problem Relation Age of Onset   Breast cancer Neg Hx     Prior to Admission medications   Medication Sig Start Date End Date Taking? Authorizing Provider  aspirin 81 MG chewable tablet Chew 81 mg by mouth daily.    [provider]  atorvastatin (LIPITOR) 20 MG tablet Take 20 mg by mouth every evening. 01/11/17   [provider]  Calcium Carbonate-Vitamin D 600-400 MG-UNIT tablet Take 1 tablet by mouth daily. Patient not taking: Reported on 08/01/2022    [provider]  cephALEXin (KEFLEX) 500 MG capsule Take 1 capsule (500 mg total) by mouth 3 (  three) times daily. 11/23/23   Tommi Rumps, PA-C  citalopram (CELEXA) 40 MG tablet Take 40 mg by mouth daily.     [provider]  HYDROcodone-acetaminophen (NORCO/VICODIN) 5-325 MG tablet Take 1-2 tablets by mouth every 6 (six) hours as needed for moderate pain. Patient not taking: Reported on 08/01/2022 04/12/21   Vanna Scotland, MD  lansoprazole (PREVACID) 15 MG capsule Take 15 mg by mouth daily.    [provider]   levothyroxine (SYNTHROID, LEVOTHROID) 75 MCG tablet Take 75 mcg by mouth daily before breakfast. 01/02/18   [provider]  Magnesium Oxide 250 MG TABS Take 250 mg by mouth daily.    [provider]  Melatonin 3 MG TABS Take 3 mg by mouth at bedtime as needed (sleep).    [provider]  Multiple Vitamin (MULTI-VITAMINS) TABS Take 1 tablet by mouth daily.    [provider]  naproxen sodium (ALEVE) 220 MG tablet Take 220 mg by mouth daily as needed.    [provider]  Omega-3 Fatty Acids (FISH OIL PO) Take 1,200 mg by mouth daily.    [provider]  oxybutynin (DITROPAN) 5 MG tablet Take 1 tablet (5 mg total) by mouth every 8 (eight) hours as needed for bladder spasms. Patient not taking: Reported on 08/01/2022 04/12/21   Vanna Scotland, MD    Physical Exam: Vitals:   12/28/23 0000 12/28/23 0001 12/28/23 0332 12/28/23 0807  BP:  (!) 194/100 (!) 188/78   Pulse:  83 82   Resp:  18 17   Temp:  98.6 F (37 C) 98.5 F (36.9 C) 99.1 F (37.3 C)  TempSrc:  Oral Oral Oral  SpO2:  99% 98%   Weight: 53.5 kg     Height: 5' (1.524 m)      Physical Exam Constitutional:      General: She is not in acute distress.    Comments: Pt hard of hearing  HENT:     Head: Normocephalic and atraumatic.     Nose: Nose normal.     Mouth/Throat:     Mouth: Mucous membranes are moist.  Eyes:     Pupils: Pupils are equal, round, and reactive to light.  Cardiovascular:     Rate and Rhythm: Normal rate and regular rhythm.  Pulmonary:     Effort: Pulmonary effort is normal.  Abdominal:     General: Abdomen is flat. Bowel sounds are normal.  Musculoskeletal:        General: Normal range of motion.  Skin:    General: Skin is warm.  Neurological:     General: No focal deficit present.  Psychiatric:        Mood and Affect: Mood normal.     Data Reviewed:  There are no new results to review at this time.  CT Renal Stone Study CLINICAL DATA:   77 year old female with history of left-sided flank pain.  EXAM: CT ABDOMEN AND PELVIS WITHOUT CONTRAST  TECHNIQUE: Multidetector CT imaging of the abdomen and pelvis was performed following the standard protocol without IV contrast.  RADIATION DOSE REDUCTION: This exam was performed according to the departmental dose-optimization program which includes automated exposure control, adjustment of the mA and/or kV according to patient size and/or use of iterative reconstruction technique.  COMPARISON:  CT of the abdomen and pelvis 11/26/2022.  FINDINGS: Lower chest: Moderate-sized hiatal hernia. Scattered areas of mild scarring in the visualize lung bases. Atherosclerotic calcifications in the descending thoracic aorta.  Hepatobiliary:  Low-attenuation lesions in the liver, incompletely characterized on today's noncontrast CT examination. The largest of these is very irregular in shape in the central aspect of the liver, predominantly centered in segments 4A, 4B and 5, with some peripheral calcifications, substantially smaller than prior study from 11/26/2022, presumably a partially involuted cyst.  Pancreas: No definite pancreatic mass or peripancreatic fluid collections or inflammatory changes are noted on today's noncontrast CT examination.  Spleen: Unremarkable.  Adrenals/Urinary Tract: Nonobstructive calculi are noted within the collecting systems of both kidneys measuring up to 6 mm in the upper pole of the right kidney. Severe right and moderate left hydroureteronephrosis. In the distal third of both ureters there are multiple calculi. The largest of these on the right measures 6 mm (axial image 58 of series 2) several cm before the right ureterovesicular junction, with an additional 4 mm calculus at the right ureterovesicular junction. On the left there is a cluster of multiple small stones just before the left ureterovesicular junction, largest of which measures 6 mm.  Urinary bladder is otherwise unremarkable. Bilateral adrenal glands are normal in appearance.  Stomach/Bowel: Unenhanced appearance of the stomach is normal. No pathologic dilatation of small bowel or colon. Numerous colonic diverticuli are noted, particularly in the region of the sigmoid colon, without surrounding inflammatory changes to indicate an acute diverticulitis at this time. Status post appendectomy.  Vascular/Lymphatic: Atherosclerotic calcifications in the abdominal aorta and pelvic vasculature. No lymphadenopathy noted in the abdomen or pelvis.  Reproductive: Status post hysterectomy. Ovaries are not confidently identified may be surgically absent or atrophic.  Other: No significant volume of ascites.  No pneumoperitoneum.  Musculoskeletal: There are no aggressive appearing lytic or blastic lesions noted in the visualized portions of the skeleton.  IMPRESSION: 1. In addition to multiple nonobstructive calculi within the collecting systems of both kidneys there are several large calculi in the distal third of both ureters at and before the ureterovesicular junctions resulting in severe right and moderate left hydroureteronephrosis. Urologic consultation is recommended. 2. Partial involution of previously noted large cystic lesion in the central aspect of the liver. This lesion has been present on numerous prior examinations dating back to 2008 and is presumably benign. 3. Colonic diverticulosis without evidence of acute diverticulitis at this time. 4. Moderate-sized hiatal hernia. 5. Aortic atherosclerosis. 6. Additional incidental findings, as above.  Electronically Signed   By: Trudie Reed M.D.   On: 12/28/2023 05:19  Lab Results  Component Value Date   WBC 17.9 (H) 12/28/2023   HGB 13.0 12/28/2023   HCT 39.1 12/28/2023   MCV 86.9 12/28/2023   PLT 267 12/28/2023   Last metabolic panel Lab Results  Component Value Date   GLUCOSE 198 (H)  12/28/2023   NA 134 (L) 12/28/2023   K 3.6 12/28/2023   CL 95 (L) 12/28/2023   CO2 24 12/28/2023   BUN 14 12/28/2023   CREATININE 1.03 (H) 12/28/2023   GFRNONAA 56 (L) 12/28/2023   CALCIUM 9.1 12/28/2023   PROT 7.6 12/28/2023   ALBUMIN 4.0 12/28/2023   BILITOT 0.7 12/28/2023   ALKPHOS 200 (H) 12/28/2023   AST 31 12/28/2023   ALT 18 12/28/2023   ANIONGAP 15 12/28/2023    Assessment and Plan: Acute bilateral obstructive uropathy Noted pyelonephritis w/ bilateral obstructive uropathy noted on CT imaging  Started on IV rocephin for infectious coverage  Dr. Lonna Cobb w/ urology made aware of case by Dr. Fuller Plan Urine culture  Follow up urology recommendations  Pain control  Antiemetics  IVF hydration  Monitor    Acute pyelonephritis Progressive back pain w/ noted obstructive uropathy bilaterally on imaging  UA indicative of infection  WBC 18  IV rocephin for urinary coverage  Pending urology evaluation for obstructive kidney stones    Leukocytosis WBC 18 on presentation  Likely secondary to pyelonephritis and bilateral obstructive uropathy  Blood and urine cultures drawn  Trend WBC w/ treatment    Diabetes mellitus type 2, uncomplicated (HCC) Blood sugar in 190s  SSI  Monitor   Hypothyroidism Cont synthroid    GERD (gastroesophageal reflux disease) PPI    Greater than 50% was spent in counseling and coordination of care with patient Total encounter time 80 minutes or more     Advance Care Planning:   Code Status: Full Code   Consults: Urology   Family Communication: No family at the bedside   Severity of Illness: The appropriate patient status for this patient is INPATIENT. Inpatient status is judged to be reasonable and necessary in order to provide the required intensity of service to ensure the patient's safety. The patient's presenting symptoms, physical exam findings, and initial radiographic and laboratory data in the context of their chronic  comorbidities is felt to place them at high risk for further clinical deterioration. Furthermore, it is not anticipated that the patient will be medically stable for discharge from the hospital within 2 midnights of admission.   * I certify that at the point of admission it is my clinical judgment that the patient will require inpatient hospital care spanning beyond 2 midnights from the point of admission due to high intensity of service, high risk for further deterioration and high frequency of surveillance required.*  Author: Floydene Flock, MD 12/28/2023 8:49 AM  For on call review [email or web address].

## 2023-12-28 NOTE — Assessment & Plan Note (Signed)
Progressive back pain w/ noted obstructive uropathy bilaterally on imaging  UA indicative of infection  WBC 18  IV rocephin for urinary coverage  Pending urology evaluation for obstructive kidney stones

## 2023-12-28 NOTE — H&P (View-Only) (Signed)
 Urology Consult  Requesting physician: Andrena Mews, MD  Reason for consultation: Bilateral obstructive uropathy w/ pyelonephritis   Assessment/Recommendations:  1.  Bilateral ureteral calculi Severe right/moderate left hydronephrosis secondary to multiple ureteral calculi Recommend cystoscopy with bilateral ureteral stent placement The procedure was discussed in detail including potential risks of bleeding, sepsis and irritative voiding symptoms and flank pain secondary to stent We discussed that any attempt at stone removal will not be performed secondary to the possibility of bacteremia/septic shock and she will need follow-up ureteroscopy for definitive stone treatment in  2-3 weeks  2.  Bilateral hydronephrosis As above  3.  Bilateral nephrolithiasis Subsequent ureteroscopy as above  4.  Urinary tract infection Leukocytosis 17.9K; urine culture has been ordered On ceftriaxone; recommend better Enterococcus coverage.  Dr. Alvester Morin was messaged   Chief Complaint: Kidney stone   History of Present Illness: Lauren White is a 77 y.o. female presented to [facility] ED evening 12/27/2023 complaining of left flank pain x 1 week.  Her daughter was with her today.  Telephone encounter with PCP 12/22/2023 complaining of urinary frequency, urgency and dysuria; UA with pyuria and microhematuria and she was started on cephalexin 500 mg bid x 7 days Urine culture subsequently Enterococcus and antibiotics was changed to nitrofurantoin 12/26/2023 Acute worsening of her pain 12/28 necessitating ED visit.  Associated with nausea/vomiting but no fever/chills CT renal stone study with bilateral nonobstructing renal calculi and bilateral hydronephrosis secondary to several bilateral calculi mid-distal ureter Leukocytosis 17.9K and UA with >50 WBC/RBC She has been started on ceftriaxone Followed by Dr. Apolinar Junes for history stone disease and underwent left ureteroscopy/stone  removal/14/22   Past Medical History:  Diagnosis Date   Anxiety    Arthritis    Borderline glaucoma    Depression    Dysrhythmia    HISTORY OF SVT, performed a cardiac ablation   Hematuria 04/07/2021   History of kidney stones    Hyperlipidemia    Hypothyroidism    Personal history of tobacco use, presenting hazards to health 08/24/2015   Pre-diabetes     Past Surgical History:  Procedure Laterality Date   ABDOMINAL HYSTERECTOMY     APPENDECTOMY     CARDIAC ELECTROPHYSIOLOGY MAPPING AND ABLATION     CYSTOSCOPY W/ RETROGRADES Bilateral 04/12/2021   Procedure: CYSTOSCOPY WITH RETROGRADE PYELOGRAM;  Surgeon: Vanna Scotland, MD;  Location: [facility] ORS;  Service: Urology;  Laterality: Bilateral;   CYSTOSCOPY/URETEROSCOPY/HOLMIUM LASER/STENT PLACEMENT Left 04/12/2021   Procedure: CYSTOSCOPY/URETEROSCOPY/HOLMIUM LASER/STENT PLACEMENT;  Surgeon: Vanna Scotland, MD;  Location: [facility] ORS;  Service: Urology;  Laterality: Left;   EYE SURGERY Left 03/13/2021   cataract and vitrectomy   URETEROSCOPY Right 04/12/2021   Procedure: URETEROSCOPY;  Surgeon: Vanna Scotland, MD;  Location: [facility] ORS;  Service: Urology;  Laterality: Right;    Home Medications:  Current Facility-Administered Medications for the 12/28/23 encounter [facility] Encounter)  Medication   cefTRIAXone (ROCEPHIN) injection 1 g   No outpatient medications have been marked as taking for the 12/28/23 encounter [facility] Encounter).    Allergies:  Allergies  Allergen Reactions   Diphenhydramine Hcl Swelling    Increases pressure in the eyes due to glaucoma   Etodolac Other (See Comments)    Chest pain   Omeprazole Other (See Comments)    Chest pain   Tramadol Other (See Comments)    Difficulty breathing   Nickel Rash   Povidone-Iodine Rash    Family History  Problem Relation Age of Onset   Breast cancer Neg Hx  Social History:  reports that she quit smoking about 19 years ago. Her smoking use included  cigarettes. She started smoking about 59 years ago. She has a 40 pack-year smoking history. She has never used smokeless tobacco. She reports that she does not drink alcohol and does not use drugs.  ROS: A complete review of systems was performed.  All systems are negative except for pertinent findings as noted.  Physical Exam:  Vital signs in last 24 hours: Temp:  [98.5 F (36.9 C)-99.1 F (37.3 C)] 99.1 F (37.3 C) (12/29 0807) Pulse Rate:  [78-95] 80 (12/29 1030) Resp:  [17-18] 18 (12/29 1000) BP: (128-194)/(57-100) 134/64 (12/29 1030) SpO2:  [98 %-100 %] 98 % (12/29 1030) Weight:  [53.5 kg] 53.5 kg (12/29 0000) Constitutional:  Alert and oriented, No acute distress HEENT: [place] AT, moist mucus membranes.  Trachea midline, no masses Cardiovascular: Regular rate and rhythm, no clubbing, cyanosis, or edema. Respiratory: Normal respiratory effort, lungs clear bilaterally GI: Abdomen is soft, nontender, nondistended, no abdominal masses GU: No CVA tenderness Skin: No rashes, bruises or suspicious lesions Lymph: No cervical or inguinal adenopathy Neurologic: Grossly intact, no focal deficits, moving all 4 extremities Psychiatric: Normal mood and affect   Laboratory Data:  Recent Labs    12/28/23 0004  WBC 17.9*  HGB 13.0  HCT 39.1   Recent Labs    12/28/23 0004  NA 134*  K 3.6  CL 95*  CO2 24  GLUCOSE 198*  BUN 14  CREATININE 1.03*  CALCIUM 9.1   No results for input(s): "LABPT", "INR" in the last 72 hours. No results for input(s): "LABURIN" in the last 72 hours. Results for orders placed or performed in visit on 04/18/21  Microscopic Examination     Status: Abnormal   Collection Time: 04/18/21  3:22 PM   Urine  Result Value Ref Range Status   WBC, UA 11-30 (A) 0 - 5 /hpf Final   RBC, Urine >30 (A) 0 - 2 /hpf Final   Epithelial Cells (non renal) 0-10 0 - 10 /hpf Final   Renal Epithel, UA 0-10 (A) None seen /hpf Final   Crystals Present (A) N/A Final   Crystal  Type Calcium Oxalate N/A Final   Bacteria, UA None seen None seen/Few Final     Radiologic Imaging: CT images were personally reviewed and interpreted.  Agree with radiology interpretation  CT Renal Stone Study Result Date: 12/28/2023 CLINICAL DATA:  77 year old female with history of left-sided flank pain. EXAM: CT ABDOMEN AND PELVIS WITHOUT CONTRAST TECHNIQUE: Multidetector CT imaging of the abdomen and pelvis was performed following the standard protocol without IV contrast. RADIATION DOSE REDUCTION: This exam was performed according to the departmental dose-optimization program which includes automated exposure control, adjustment of the mA and/or kV according to patient size and/or use of iterative reconstruction technique. COMPARISON:  CT of the abdomen and pelvis 11/26/2022. FINDINGS: Lower chest: Moderate-sized hiatal hernia. Scattered areas of mild scarring in the visualize lung bases. Atherosclerotic calcifications in the descending thoracic aorta. Hepatobiliary: Low-attenuation lesions in the liver, incompletely characterized on today's noncontrast CT examination. The largest of these is very irregular in shape in the central aspect of the liver, predominantly centered in segments 4A, 4B and 5, with some peripheral calcifications, substantially smaller than prior study from 11/26/2022, presumably a partially involuted cyst. Pancreas: No definite pancreatic mass or peripancreatic fluid collections or inflammatory changes are noted on today's noncontrast CT examination. Spleen: Unremarkable. Adrenals/Urinary Tract: Nonobstructive calculi are noted within  the collecting systems of both kidneys measuring up to 6 mm in the upper pole of the right kidney. Severe right and moderate left hydroureteronephrosis. In the distal third of both ureters there are multiple calculi. The largest of these on the right measures 6 mm (axial image 58 of series 2) several cm before the right ureterovesicular junction,  with an additional 4 mm calculus at the right ureterovesicular junction. On the left there is a cluster of multiple small stones just before the left ureterovesicular junction, largest of which measures 6 mm. Urinary bladder is otherwise unremarkable. Bilateral adrenal glands are normal in appearance. Stomach/Bowel: Unenhanced appearance of the stomach is normal. No pathologic dilatation of small bowel or colon. Numerous colonic diverticuli are noted, particularly in the region of the sigmoid colon, without surrounding inflammatory changes to indicate an acute diverticulitis at this time. Status post appendectomy. Vascular/Lymphatic: Atherosclerotic calcifications in the abdominal aorta and pelvic vasculature. No lymphadenopathy noted in the abdomen or pelvis. Reproductive: Status post hysterectomy. Ovaries are not confidently identified may be surgically absent or atrophic. Other: No significant volume of ascites.  No pneumoperitoneum. Musculoskeletal: There are no aggressive appearing lytic or blastic lesions noted in the visualized portions of the skeleton. IMPRESSION: 1. In addition to multiple nonobstructive calculi within the collecting systems of both kidneys there are several large calculi in the distal third of both ureters at and before the ureterovesicular junctions resulting in severe right and moderate left hydroureteronephrosis. Urologic consultation is recommended. 2. Partial involution of previously noted large cystic lesion in the central aspect of the liver. This lesion has been present on numerous prior examinations dating back to 2008 and is presumably benign. 3. Colonic diverticulosis without evidence of acute diverticulitis at this time. 4. Moderate-sized hiatal hernia. 5. Aortic atherosclerosis. 6. Additional incidental findings, as above. Electronically Signed   By: Trudie Reed M.D.   On: 12/28/2023 05:19     12/28/2023, 11:59 AM  Irineo Axon,  MD

## 2023-12-28 NOTE — Op Note (Signed)
Preoperative diagnosis:  Bilateral obstructing ureteral calculi Bilateral hydronephrosis secondary to above UTI  Postoperative diagnosis:  Same  Procedure:  Cystoscopy Bilateral ureteral stent placement (13F/22 cm) Bilateral retrograde pyelography with interpretation  Surgeon: Lorin Picket C. Laraine Samet, M.D.  Anesthesia: General  Complications: None  Intraoperative findings:  Cystoscopy: Hyperemic bladder mucosa endoscopically consistent with inflammatory change.  No solid or papillary lesions.  UOs normal-appearing bilaterally Right retrograde pyelogram: Severe left hydronephrosis/hydroureter with columning of contrast to the upper portion of the distal ureter Left retrograde pyelogram: Moderate left hydronephrosis/hydroureter with columning of contrast to the distal ureter  EBL: Minimal  Specimens: None  Indication: Lauren White is a 77 y.o. female to the ED with left flank pain x 1 week associated with urinary frequency, urgency and dysuria.  Urine culture performed [facility] grew Enterococcus.  She had worsening pain and CT in the ED showed severe right hydronephrosis and moderate left hydronephrosis with multiple ureteral calculi and nonobstructing renal calculi.  She had leukocytosis but no signs of septic shock.  After reviewing the management options for treatment, she elected to proceed with the above surgical procedure(s). We have discussed the potential benefits and risks of the procedure, side effects of the proposed treatment, the likelihood of the patient achieving the goals of the procedure, and any potential problems that might occur during the procedure or recuperation. Informed consent has been obtained.  Description of procedure:  The patient was taken to the operating room and general anesthesia was induced.  The patient was placed in the dorsal lithotomy position, prepped and draped in the usual sterile fashion, and preoperative antibiotics were  administered. A preoperative time-out was performed.   A 21 French cystoscope sheath with obturator was lubricated and passed per urethra.  A 30 degree lens was then placed and panendoscopy was performed with findings as described above.  Attention was directed to the right ureteral orifice and a 0.038 Sensor guidewire was then advanced up the ureter however resistance was met near the junction of the mid and distal ureter.  A 13F open-ended ureteral catheter was advanced over the wire and positioned just distal to the area of resistance and the Sensor wire was able to be advanced into the renal pelvis.  The ureteral catheter was advanced over the guidewire to the region of the renal pelvis.  The guidewire was removed and 5 cc of amber slightly cloudy urine was aspirated and sent for culture.  Retrograde pyelogram was then performed through the ureteral catheter with findings described above.  The guidewire was replaced and the ureteral catheter was removed.  Initially a 4.24F/20 cm Percuflex Plus ureteral stent was placed over the guidewire however the proximal curl was positioned in the dilated portion of the proximal ureter.  The stent was removed and exchanged for a 13F/22 cm Contour ureteral stent.  Good positioning was noted in the renal pelvis and bladder.    An identical procedure was then performed on the contralateral side.  Left retrograde pyelogram was performed with findings described above.  5 cc of urine was aspirated and was slightly cloudy/amber in appearance  The bladder was then emptied and the procedure ended.  The patient appeared to tolerate the procedure well and without complications.  After anesthetic reversal the patient was transported to the PACU in stable condition.  Plan: She has been admitted to the hospitalist service and will continue IV antibiotics Definitive stone treatment with ureteroscopy/laser lithotripsy ~ 2 weeks   Irineo Axon, MD

## 2023-12-28 NOTE — Anesthesia Procedure Notes (Signed)
Procedure Name: Intubation Date/Time: 12/28/2023 7:09 PM  Performed by: Joanette Gula, Sharlena Kristensen, CRNAPre-anesthesia Checklist: Patient identified, Emergency Drugs available, Suction available and Patient being monitored Patient Re-evaluated:Patient Re-evaluated prior to induction Oxygen Delivery Method: Circle system utilized Preoxygenation: Pre-oxygenation with 100% oxygen Induction Type: IV induction Ventilation: Mask ventilation without difficulty Laryngoscope Size: McGrath and 3 Grade View: Grade I Tube type: Oral Tube size: 6.5 mm Number of attempts: 1 Airway Equipment and Method: Stylet and Oral airway Placement Confirmation: ETT inserted through vocal cords under direct vision, positive ETCO2 and breath sounds checked- equal and bilateral Secured at: 17 cm Tube secured with: Tape Dental Injury: Teeth and Oropharynx as per pre-operative assessment

## 2023-12-28 NOTE — Consult Note (Signed)
Urology Consult  Requesting physician: Andrena Mews, MD  Reason for consultation: Bilateral obstructive uropathy w/ pyelonephritis   Assessment/Recommendations:  1.  Bilateral ureteral calculi Severe right/moderate left hydronephrosis secondary to multiple ureteral calculi Recommend cystoscopy with bilateral ureteral stent placement The procedure was discussed in detail including potential risks of bleeding, sepsis and irritative voiding symptoms and flank pain secondary to stent We discussed that any attempt at stone removal will not be performed secondary to the possibility of bacteremia/septic shock and she will need follow-up ureteroscopy for definitive stone treatment in  2-3 weeks  2.  Bilateral hydronephrosis As above  3.  Bilateral nephrolithiasis Subsequent ureteroscopy as above  4.  Urinary tract infection Leukocytosis 17.9K; urine culture has been ordered On ceftriaxone; recommend better Enterococcus coverage.  Dr. Alvester Morin was messaged   Chief Complaint: Kidney stone   History of Present Illness: Lauren White is a 77 y.o. female presented to [facility] ED evening 12/27/2023 complaining of left flank pain x 1 week.  Her daughter was with her today.  Telephone encounter with PCP 12/22/2023 complaining of urinary frequency, urgency and dysuria; UA with pyuria and microhematuria and she was started on cephalexin 500 mg bid x 7 days Urine culture subsequently Enterococcus and antibiotics was changed to nitrofurantoin 12/26/2023 Acute worsening of her pain 12/28 necessitating ED visit.  Associated with nausea/vomiting but no fever/chills CT renal stone study with bilateral nonobstructing renal calculi and bilateral hydronephrosis secondary to several bilateral calculi mid-distal ureter Leukocytosis 17.9K and UA with >50 WBC/RBC She has been started on ceftriaxone Followed by Dr. Apolinar Junes for history stone disease and underwent left ureteroscopy/stone  removal/14/22   Past Medical History:  Diagnosis Date   Anxiety    Arthritis    Borderline glaucoma    Depression    Dysrhythmia    HISTORY OF SVT, performed a cardiac ablation   Hematuria 04/07/2021   History of kidney stones    Hyperlipidemia    Hypothyroidism    Personal history of tobacco use, presenting hazards to health 08/24/2015   Pre-diabetes     Past Surgical History:  Procedure Laterality Date   ABDOMINAL HYSTERECTOMY     APPENDECTOMY     CARDIAC ELECTROPHYSIOLOGY MAPPING AND ABLATION     CYSTOSCOPY W/ RETROGRADES Bilateral 04/12/2021   Procedure: CYSTOSCOPY WITH RETROGRADE PYELOGRAM;  Surgeon: Vanna Scotland, MD;  Location: [facility] ORS;  Service: Urology;  Laterality: Bilateral;   CYSTOSCOPY/URETEROSCOPY/HOLMIUM LASER/STENT PLACEMENT Left 04/12/2021   Procedure: CYSTOSCOPY/URETEROSCOPY/HOLMIUM LASER/STENT PLACEMENT;  Surgeon: Vanna Scotland, MD;  Location: [facility] ORS;  Service: Urology;  Laterality: Left;   EYE SURGERY Left 03/13/2021   cataract and vitrectomy   URETEROSCOPY Right 04/12/2021   Procedure: URETEROSCOPY;  Surgeon: Vanna Scotland, MD;  Location: [facility] ORS;  Service: Urology;  Laterality: Right;    Home Medications:  Current Facility-Administered Medications for the 12/28/23 encounter [facility] Encounter)  Medication   cefTRIAXone (ROCEPHIN) injection 1 g   No outpatient medications have been marked as taking for the 12/28/23 encounter [facility] Encounter).    Allergies:  Allergies  Allergen Reactions   Diphenhydramine Hcl Swelling    Increases pressure in the eyes due to glaucoma   Etodolac Other (See Comments)    Chest pain   Omeprazole Other (See Comments)    Chest pain   Tramadol Other (See Comments)    Difficulty breathing   Nickel Rash   Povidone-Iodine Rash    Family History  Problem Relation Age of Onset   Breast cancer Neg Hx  Social History:  reports that she quit smoking about 19 years ago. Her smoking use included  cigarettes. She started smoking about 59 years ago. She has a 40 pack-year smoking history. She has never used smokeless tobacco. She reports that she does not drink alcohol and does not use drugs.  ROS: A complete review of systems was performed.  All systems are negative except for pertinent findings as noted.  Physical Exam:  Vital signs in last 24 hours: Temp:  [98.5 F (36.9 C)-99.1 F (37.3 C)] 99.1 F (37.3 C) (12/29 0807) Pulse Rate:  [78-95] 80 (12/29 1030) Resp:  [17-18] 18 (12/29 1000) BP: (128-194)/(57-100) 134/64 (12/29 1030) SpO2:  [98 %-100 %] 98 % (12/29 1030) Weight:  [53.5 kg] 53.5 kg (12/29 0000) Constitutional:  Alert and oriented, No acute distress HEENT: [place] AT, moist mucus membranes.  Trachea midline, no masses Cardiovascular: Regular rate and rhythm, no clubbing, cyanosis, or edema. Respiratory: Normal respiratory effort, lungs clear bilaterally GI: Abdomen is soft, nontender, nondistended, no abdominal masses GU: No CVA tenderness Skin: No rashes, bruises or suspicious lesions Lymph: No cervical or inguinal adenopathy Neurologic: Grossly intact, no focal deficits, moving all 4 extremities Psychiatric: Normal mood and affect   Laboratory Data:  Recent Labs    12/28/23 0004  WBC 17.9*  HGB 13.0  HCT 39.1   Recent Labs    12/28/23 0004  NA 134*  K 3.6  CL 95*  CO2 24  GLUCOSE 198*  BUN 14  CREATININE 1.03*  CALCIUM 9.1   No results for input(s): "LABPT", "INR" in the last 72 hours. No results for input(s): "LABURIN" in the last 72 hours. Results for orders placed or performed in visit on 04/18/21  Microscopic Examination     Status: Abnormal   Collection Time: 04/18/21  3:22 PM   Urine  Result Value Ref Range Status   WBC, UA 11-30 (A) 0 - 5 /hpf Final   RBC, Urine >30 (A) 0 - 2 /hpf Final   Epithelial Cells (non renal) 0-10 0 - 10 /hpf Final   Renal Epithel, UA 0-10 (A) None seen /hpf Final   Crystals Present (A) N/A Final   Crystal  Type Calcium Oxalate N/A Final   Bacteria, UA None seen None seen/Few Final     Radiologic Imaging: CT images were personally reviewed and interpreted.  Agree with radiology interpretation  CT Renal Stone Study Result Date: 12/28/2023 CLINICAL DATA:  77 year old female with history of left-sided flank pain. EXAM: CT ABDOMEN AND PELVIS WITHOUT CONTRAST TECHNIQUE: Multidetector CT imaging of the abdomen and pelvis was performed following the standard protocol without IV contrast. RADIATION DOSE REDUCTION: This exam was performed according to the departmental dose-optimization program which includes automated exposure control, adjustment of the mA and/or kV according to patient size and/or use of iterative reconstruction technique. COMPARISON:  CT of the abdomen and pelvis 11/26/2022. FINDINGS: Lower chest: Moderate-sized hiatal hernia. Scattered areas of mild scarring in the visualize lung bases. Atherosclerotic calcifications in the descending thoracic aorta. Hepatobiliary: Low-attenuation lesions in the liver, incompletely characterized on today's noncontrast CT examination. The largest of these is very irregular in shape in the central aspect of the liver, predominantly centered in segments 4A, 4B and 5, with some peripheral calcifications, substantially smaller than prior study from 11/26/2022, presumably a partially involuted cyst. Pancreas: No definite pancreatic mass or peripancreatic fluid collections or inflammatory changes are noted on today's noncontrast CT examination. Spleen: Unremarkable. Adrenals/Urinary Tract: Nonobstructive calculi are noted within  the collecting systems of both kidneys measuring up to 6 mm in the upper pole of the right kidney. Severe right and moderate left hydroureteronephrosis. In the distal third of both ureters there are multiple calculi. The largest of these on the right measures 6 mm (axial image 58 of series 2) several cm before the right ureterovesicular junction,  with an additional 4 mm calculus at the right ureterovesicular junction. On the left there is a cluster of multiple small stones just before the left ureterovesicular junction, largest of which measures 6 mm. Urinary bladder is otherwise unremarkable. Bilateral adrenal glands are normal in appearance. Stomach/Bowel: Unenhanced appearance of the stomach is normal. No pathologic dilatation of small bowel or colon. Numerous colonic diverticuli are noted, particularly in the region of the sigmoid colon, without surrounding inflammatory changes to indicate an acute diverticulitis at this time. Status post appendectomy. Vascular/Lymphatic: Atherosclerotic calcifications in the abdominal aorta and pelvic vasculature. No lymphadenopathy noted in the abdomen or pelvis. Reproductive: Status post hysterectomy. Ovaries are not confidently identified may be surgically absent or atrophic. Other: No significant volume of ascites.  No pneumoperitoneum. Musculoskeletal: There are no aggressive appearing lytic or blastic lesions noted in the visualized portions of the skeleton. IMPRESSION: 1. In addition to multiple nonobstructive calculi within the collecting systems of both kidneys there are several large calculi in the distal third of both ureters at and before the ureterovesicular junctions resulting in severe right and moderate left hydroureteronephrosis. Urologic consultation is recommended. 2. Partial involution of previously noted large cystic lesion in the central aspect of the liver. This lesion has been present on numerous prior examinations dating back to 2008 and is presumably benign. 3. Colonic diverticulosis without evidence of acute diverticulitis at this time. 4. Moderate-sized hiatal hernia. 5. Aortic atherosclerosis. 6. Additional incidental findings, as above. Electronically Signed   By: Trudie Reed M.D.   On: 12/28/2023 05:19     12/28/2023, 11:59 AM  Irineo Axon,  MD

## 2023-12-29 ENCOUNTER — Other Ambulatory Visit: Payer: Self-pay

## 2023-12-29 ENCOUNTER — Other Ambulatory Visit: Payer: Self-pay | Admitting: Physician Assistant

## 2023-12-29 ENCOUNTER — Encounter: Payer: Self-pay | Admitting: Urology

## 2023-12-29 DIAGNOSIS — N139 Obstructive and reflux uropathy, unspecified: Secondary | ICD-10-CM | POA: Diagnosis not present

## 2023-12-29 DIAGNOSIS — N201 Calculus of ureter: Secondary | ICD-10-CM | POA: Diagnosis not present

## 2023-12-29 LAB — COMPREHENSIVE METABOLIC PANEL
ALT: 29 U/L (ref 0–44)
AST: 38 U/L (ref 15–41)
Albumin: 2.9 g/dL — ABNORMAL LOW (ref 3.5–5.0)
Alkaline Phosphatase: 150 U/L — ABNORMAL HIGH (ref 38–126)
Anion gap: 7 (ref 5–15)
BUN: 19 mg/dL (ref 8–23)
CO2: 26 mmol/L (ref 22–32)
Calcium: 8.2 mg/dL — ABNORMAL LOW (ref 8.9–10.3)
Chloride: 106 mmol/L (ref 98–111)
Creatinine, Ser: 0.74 mg/dL (ref 0.44–1.00)
GFR, Estimated: >60 mL/min (ref >60)
Glucose, Bld: 161 mg/dL — ABNORMAL HIGH (ref 70–99)
Potassium: 3.9 mmol/L (ref 3.5–5.1)
Sodium: 139 mmol/L (ref 135–145)
Total Bilirubin: 0.4 mg/dL (ref <1.2)
Total Protein: 5.9 g/dL — ABNORMAL LOW (ref 6.5–8.1)

## 2023-12-29 LAB — HEMOGLOBIN A1C
Hgb A1c MFr Bld: 6.4 % — ABNORMAL HIGH (ref 4.8–5.6)
Mean Plasma Glucose: 137 mg/dL

## 2023-12-29 LAB — URINE CULTURE: Culture: NO GROWTH

## 2023-12-29 LAB — CBC
HCT: 31.4 % — ABNORMAL LOW (ref 36.0–46.0)
Hemoglobin: 10.4 g/dL — ABNORMAL LOW (ref 12.0–15.0)
MCH: 28.7 pg (ref 26.0–34.0)
MCHC: 33.1 g/dL (ref 30.0–36.0)
MCV: 86.5 fL (ref 80.0–100.0)
Platelets: 196 10*3/uL (ref 150–400)
RBC: 3.63 MIL/uL — ABNORMAL LOW (ref 3.87–5.11)
RDW: 14 % (ref 11.5–15.5)
WBC: 11.5 10*3/uL — ABNORMAL HIGH (ref 4.0–10.5)
nRBC: 0 % (ref 0.0–0.2)

## 2023-12-29 LAB — GLUCOSE, CAPILLARY
Glucose-Capillary: 120 mg/dL — ABNORMAL HIGH (ref 70–99)
Glucose-Capillary: 175 mg/dL — ABNORMAL HIGH (ref 70–99)
Glucose-Capillary: 111 mg/dL — ABNORMAL HIGH (ref 70–99)
Glucose-Capillary: 113 mg/dL — ABNORMAL HIGH (ref 70–99)

## 2023-12-29 MED ORDER — PANTOPRAZOLE SODIUM 40 MG PO TBEC
40.0000 mg | DELAYED_RELEASE_TABLET | Freq: Every day | ORAL | Status: DC
  Administered 2023-12-29 – 2023-12-30 (×2): 40 mg via ORAL
  Filled 2023-12-29 (×2): qty 1

## 2023-12-29 MED ORDER — MAGNESIUM OXIDE -MG SUPPLEMENT 400 (240 MG) MG PO TABS
200.0000 mg | ORAL_TABLET | Freq: Every day | ORAL | Status: DC
  Administered 2023-12-29 – 2023-12-30 (×2): 200 mg via ORAL
  Filled 2023-12-29 (×2): qty 1

## 2023-12-29 MED ORDER — SODIUM CHLORIDE 0.9 % IV SOLN
3.0000 g | Freq: Three times a day (TID) | INTRAVENOUS | Status: DC
  Administered 2023-12-29 – 2023-12-30 (×3): 3 g via INTRAVENOUS
  Filled 2023-12-29 (×4): qty 8

## 2023-12-29 MED ORDER — DOCUSATE SODIUM 100 MG PO CAPS
100.0000 mg | ORAL_CAPSULE | Freq: Two times a day (BID) | ORAL | Status: DC
  Administered 2023-12-29: 100 mg via ORAL
  Filled 2023-12-29: qty 1

## 2023-12-29 MED ORDER — MELATONIN 5 MG PO TABS
2.5000 mg | ORAL_TABLET | Freq: Every evening | ORAL | Status: DC | PRN
  Administered 2023-12-29: 2.5 mg via ORAL
  Filled 2023-12-29: qty 1

## 2023-12-29 MED ORDER — CITALOPRAM HYDROBROMIDE 20 MG PO TABS
40.0000 mg | ORAL_TABLET | Freq: Every day | ORAL | Status: DC
  Administered 2023-12-29 – 2023-12-30 (×2): 40 mg via ORAL
  Filled 2023-12-29 (×2): qty 2

## 2023-12-29 NOTE — Evaluation (Signed)
Physical Therapy Evaluation Patient Details Name: Lauren White MRN: [id number] DOB: 1946/11/20 Today's Date: 12/29/2023  History of Present Illness  Pt is a 30 s/p cystoscopy with stent. PMH of former smoking, PVD, afib s/p ablation, DM, anxiety.  Clinical Impression  Patient A&Ox4 reported mild L flank pain (1/10). She endorsed at baseline she is modI with her rollator, drives, does her own IADLs. The pt was sitting EOB upon PT arrival, able to doff and don socks without assistance. Sit <> stand with RW, modI, and ambulated ~322ft with RW, supervision. No LOB, no gait deviations, pt did endorse some fatigue compared to baseline. The patient demonstrated and reported near return to baseline level of functioning, no further acute PT needs indicated. PT to sign off. Please reconsult PT if pt status changes or acute needs are identified.          If plan is discharge home, recommend the following: Assist for transportation   Can travel by private vehicle        Equipment Recommendations None recommended by PT  Recommendations for Other Services       Functional Status Assessment Patient has not had a recent decline in their functional status     Precautions / Restrictions Precautions Precautions: Fall Restrictions Weight Bearing Restrictions Per Provider Order: No      Mobility  Bed Mobility               General bed mobility comments: pt sitting EOB at start of session    Transfers Overall transfer level: Modified independent Equipment used: Rolling walker (2 wheels)                    Ambulation/Gait Ambulation/Gait assistance: Supervision Gait Distance (Feet): 320 Feet Assistive device: Rolling walker (2 wheels)         General Gait Details: no LOB, no major gait deviations. pt endorsed mild fatigue compared to baseline  Stairs            Wheelchair Mobility     Tilt Bed    Modified Rankin (Stroke Patients Only)        Balance Overall balance assessment: No apparent balance deficits (not formally assessed) Sitting-balance support: Feet supported Sitting balance-Leahy Scale: Normal     Standing balance support: Bilateral upper extremity supported Standing balance-Leahy Scale: Good                               Pertinent Vitals/Pain Pain Assessment Pain Assessment: 0-10 Pain Score: 1  Pain Location: L flank pain Pain Descriptors / Indicators: Sore Pain Intervention(s): Limited activity within patient's tolerance, Monitored during session, Repositioned    Home Living Family/patient expects to be discharged to:: Private residence Living Arrangements: Alone Available Help at Discharge: Family;Neighbor Type of Home: House Home Access: Stairs to enter Entrance Stairs-Rails: Left Entrance Stairs-Number of Steps: 3-4   Home Layout: One level Home Equipment: Agricultural consultant (2 wheels);Rollator (4 wheels);Shower seat;Grab bars - tub/shower;Grab bars - toilet      Prior Function Prior Level of Function : Independent/Modified Independent;Driving             Mobility Comments: modI with rollator       Extremity/Trunk Assessment   Upper Extremity Assessment Upper Extremity Assessment: Defer to OT evaluation    Lower Extremity Assessment Lower Extremity Assessment:  (grossly 3/4)    Cervical / Trunk Assessment Cervical / Trunk Assessment: Normal  Communication  Cognition Arousal: Alert Behavior During Therapy: WFL for tasks assessed/performed Overall Cognitive Status: Within Functional Limits for tasks assessed                                          General Comments      Exercises     Assessment/Plan    PT Assessment Patient does not need any further PT services  PT Problem List         PT Treatment Interventions      PT Goals (Current goals can be found in the Care Plan section)       Frequency       Co-evaluation                AM-PAC PT "6 Clicks" Mobility  Outcome Measure Help needed turning from your back to your side while in a flat bed without using bedrails?: None Help needed moving from lying on your back to sitting on the side of a flat bed without using bedrails?: None Help needed moving to and from a bed to a chair (including a wheelchair)?: None Help needed standing up from a chair using your arms (e.g., wheelchair or bedside chair)?: None Help needed to walk in hospital room?: None Help needed climbing 3-5 steps with a railing? : A Little 6 Click Score: 23    End of Session Equipment Utilized During Treatment: Gait belt Activity Tolerance: Patient tolerated treatment well Patient left: in chair;Other (comment) (with OT present)   PT Visit Diagnosis: Other abnormalities of gait and mobility (R26.89)    Time: 4098-1191 PT Time Calculation (min) (ACUTE ONLY): 19 min   Charges:   PT Evaluation $PT Eval Low Complexity: 1 Low PT Treatments $Therapeutic Activity: 8-22 mins PT General Charges $$ ACUTE PT VISIT: 1 Visit         Olga Coaster PT, DPT 9:49 AM,12/29/23

## 2023-12-29 NOTE — Progress Notes (Signed)
Progress Note   Patient: Lauren White:[id number] DOB: [birth year] DOA: 12/28/2023     1 DOS: the patient was seen and examined on 12/29/2023   Brief hospital course: 77yo with h/o hypothyroidism, HLD, and nephrolithiasis who presented with flank pain and was found to have obstructive uropathy with pyelonephritis.  She underwent cystoscopy with B ureteral stent placement on 12/29.  Assessment and Plan:  Acute bilateral obstructive uropathy with pyelonephritis Patient presenting with obstructive uropathy with infection Underwent cystoscopy with B ureteral stent placement on 12/29 Started on IV rocephin for infectious coverage  Urine culture negative from admission; repeat cultures pending from procedure She will need definitive stone treatment with ureteroscopy/laser lithotripsy in about 2 weeks Blood cultures NTD x 1 day    Diabetes mellitus type 2, uncomplicated  A1c 6.4, good control Diet controlled No needs for monitoring/treatment at this time   Hypothyroidism Continue Synthroid    GERD (gastroesophageal reflux disease) Continue PPI   HLD Continue atorvastatin, ASA Hold fish oil while inpatient  Anxiety Continue citalopram, melatonin    Consultants: Urology  Procedures: Cystoscopy with B ureteral stent placement 12/29  Antibiotics: Ceftriaxone x 1 dose Unasyn 12/30-   30 Day Unplanned Readmission Risk Score    Flowsheet Row ED to Hosp-Admission (Current) from 12/28/2023 in [facility]  30 Day Unplanned Readmission Risk Score (%) 7.43 Filed at 12/29/2023 0400       This score is the patient's risk of an unplanned readmission within 30 days of being discharged (0 -100%). The score is based on dignosis, age, lab data, medications, orders, and past utilization.   Low:  0-14.9   Medium: 15-21.9   High: 22-29.9   Extreme: 30 and above           Subjective: Feeling better.  She is no longer having frank  pain, just vague pelvic discomfort.  No urinary symptoms.   Objective: Vitals:   12/29/23 0223 12/29/23 0842  BP: 132/67 139/72  Pulse: 83 80  Resp: 18 18  Temp: (!) 97.5 F (36.4 C) 97.8 F (36.6 C)  SpO2: 99% 98%    Intake/Output Summary (Last 24 hours) at 12/29/2023 1725 Last data filed at 12/29/2023 0139 Gross per 24 hour  Intake 1703.27 ml  Output 1 ml  Net 1702.27 ml   Filed Weights   12/28/23 0000 12/28/23 2108  Weight: 53.5 kg 55.3 kg    Exam:  General:  Appears calm and comfortable and is in NAD, sitting up in bedside chair Eyes:  EOMI, normal lids, iris ENT:  grossly normal hearing, lips & tongue, mmm Neck:  no LAD, masses or thyromegaly Cardiovascular:  RRR, no m/r/g. No LE edema.  Respiratory:   CTA bilaterally with no wheezes/rales/rhonchi.  Normal respiratory effort. Abdomen:  soft, vague suprapubic discomfort Back:   normal alignment, no CVAT Skin:  no rash or induration seen on limited exam Musculoskeletal:  grossly normal tone BUE/BLE, good ROM, no bony abnormality Psychiatric:  grossly normal mood and affect, speech fluent and appropriate, AOx3 Neurologic:  CN 2-12 grossly intact, moves all extremities in coordinated fashion  Data Reviewed: I have reviewed the patient's lab results since admission.  Pertinent labs for today include:   Glucose 161 Albumin 2.9 WBC 11.5 Hgb 10.4 Blood culture NTD x 24 hours Urine culture pending     Family Communication: Daughter was present throughout evaluation  Disposition: Status is: Inpatient Remains inpatient appropriate because: ongoing monitoring     Time spent:  50 minutes  Unresulted Labs (From admission, onward)     Start     Ordered   12/30/23 0500  CBC with Differential/Platelet  Tomorrow morning,   R        12/29/23 1725   12/30/23 0500  Basic metabolic panel  Tomorrow morning,   R        12/29/23 1725             Author: Jonah Blue, MD 12/29/2023 5:25 PM  For on call  review [email or web address].

## 2023-12-29 NOTE — Hospital Course (Signed)
77yo with h/o hypothyroidism, HLD, and nephrolithiasis who presented with flank pain and was found to have obstructive uropathy with pyelonephritis.  She underwent cystoscopy with B ureteral stent placement on 12/29.

## 2023-12-29 NOTE — Evaluation (Signed)
Occupational Therapy Evaluation Patient Details Name: Lauren White MRN: [id number] DOB: 1946-06-12 Today's Date: 12/29/2023   History of Present Illness Pt is a 29 s/p cystoscopy with stent. PMH of former smoking, PVD, afib s/p ablation, DM, anxiety.   Clinical Impression   Patient received for OT evaluation. See flowsheet below for details of function. Patient MOD (I) BADLs and appears to be at baseline.  Patient with no further need for OT in acute care; discharge OT services.        If plan is discharge home, recommend the following:  No needs.    Functional Status Assessment  Patient has not had a recent decline in their functional status  Equipment Recommendations  None recommended by OT    Recommendations for Other Services       Precautions / Restrictions Precautions Precautions: Fall Restrictions Weight Bearing Restrictions Per Provider Order: No      Mobility Bed Mobility               General bed mobility comments: Not tested; pt found and left in recliner.    Transfers Overall transfer level: Modified independent Equipment used: None               General transfer comment: Pt stood from recliner without assistance, then OT provided HHA for walk to bathroomm.      Balance Overall balance assessment: No apparent balance deficits (not formally assessed)                                         ADL either performed or assessed with clinical judgement   ADL Overall ADL's : Modified independent                                       General ADL Comments: Pt received seated in recliner having just finished walking with PT with RW. Requested to use the bathroom. Pt declines RW and desired HHA to bathroom; pt with HHA in one hand and IV pole in other hand to walk steadily to the bathroom. Transferred with grab bar and toileted independently. States she has already completed grooming today. Able to t/f to  standing independently and return to recliner with two hands on IV pole. Pt declines further needs at this time and feels at baseline function.     Vision Baseline Vision/History: 0 No visual deficits Patient Visual Report: No change from baseline       Perception         Praxis         Pertinent Vitals/Pain Pain Assessment Pain Assessment: 0-10 Pain Score: 1  Pain Location: L flank pain Pain Descriptors / Indicators: Sore Pain Intervention(s): Limited activity within patient's tolerance     Extremity/Trunk Assessment Upper Extremity Assessment Upper Extremity Assessment: Overall WFL for tasks assessed   Lower Extremity Assessment Lower Extremity Assessment: Defer to PT evaluation   Cervical / Trunk Assessment Cervical / Trunk Assessment: Normal   Communication Communication Communication: No apparent difficulties   Cognition Arousal: Alert Behavior During Therapy: WFL for tasks assessed/performed Overall Cognitive Status: Within Functional Limits for tasks assessed  General Comments  Stable throughout session; on room air.    Exercises     Shoulder Instructions      Home Living Family/patient expects to be discharged to:: Private residence Living Arrangements: Alone Available Help at Discharge: Family;Neighbor Type of Home: House Home Access: Stairs to enter [employer] of Steps: 3-4 Entrance Stairs-Rails: Left Home Layout: One level     Bathroom Shower/Tub: Chief Strategy Officer: Standard     Home Equipment: Agricultural consultant (2 wheels);Rollator (4 wheels);Shower seat;Grab bars - tub/shower;Grab bars - toilet   Additional Comments: Pt states she has one daughter living nearby, another daughter in [place], and lots of supportive neighbors.      Prior Functioning/Environment Prior Level of Function : Independent/Modified Independent;Driving             Mobility  Comments: MOD (I) with rollator at baseline. ADLs Comments: (I) with BADLs. Drives. One fall approx 1 year ago.        OT Problem List:        OT Treatment/Interventions:      OT Goals(Current goals can be found in the care plan section) Acute Rehab OT Goals Patient Stated Goal: Go home OT Goal Formulation: All assessment and education complete, DC therapy  OT Frequency:      Co-evaluation              AM-PAC OT "6 Clicks" Daily Activity     Outcome Measure Help from another person eating meals?: None Help from another person taking care of personal grooming?: None Help from another person toileting, which includes using toliet, bedpan, or urinal?: None Help from another person bathing (including washing, rinsing, drying)?: None Help from another person to put on and taking off regular upper body clothing?: None Help from another person to put on and taking off regular lower body clothing?: None 6 Click Score: 24   End of Session Nurse Communication: Mobility status  Activity Tolerance: Patient tolerated treatment well Patient left: in chair;with call bell/phone within reach;with chair alarm set                   Time: 0102-7253 OT Time Calculation (min): 12 min Charges:  OT General Charges $OT Visit: 1 Visit OT Evaluation $OT Eval Low Complexity: 1 Low  Linward Foster, MS, OTR/L  Alvester Morin 12/29/2023, 9:59 AM

## 2023-12-29 NOTE — Progress Notes (Signed)
Pharmacy Antibiotic Note  Lauren White is a 77 y.o. female admitted on 12/28/2023 with UTI.  Patient presented with 5-6 days of progressive flan pain. Was evaluated by PCP initially and was placed on oral antibiotics, cephalexin. Symptoms still persisted despite treatment. 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 Patient originally started on ceftriaxone on presentation to ED, now MD is transitioning to Unasyn. Pharmacy has been consulted for Unasyn dosing.  Plan: Unasyn 3 g IV q8h Continue to monitor renal function and follow culture results   Height: 5' (152.4 cm) Weight: 55.3 kg (122 lb) IBW/kg (Calculated) : 45.5  Temp (24hrs), Avg:98 F (36.7 C), Min:97.5 F (36.4 C), Max:98.6 F (37 C)  Recent Labs  Lab 12/28/23 0004 12/29/23 0446  WBC 17.9* 11.5*  CREATININE 1.03* 0.74    Estimated Creatinine Clearance: 45.2 mL/min (by C-G formula based on SCr of 0.74 mg/dL).    Allergies  Allergen Reactions   Diphenhydramine Hcl Swelling    Increases pressure in the eyes due to glaucoma   Etodolac Other (See Comments)    Chest pain   Omeprazole Other (See Comments)    Chest pain   Tramadol Other (See Comments)    Difficulty breathing   Nickel Rash   Povidone-Iodine Rash    Antimicrobials this admission: 12/29 Ceftriaxone x 2 doses  12/29 Unasyn >>   Dose adjustments this admission: None  Microbiology results: 12/29 BCx: IP 12/29 UCx: IP    Thank you for allowing pharmacy to be a part of this patient's care.  Angelique Blonder, PharmD Clinical  Pharmacist 12/29/2023 11:30 AM

## 2023-12-29 NOTE — Progress Notes (Unsigned)
Surgical Physician Order Form [facility] [place]  Dr. Apolinar Junes * Scheduling expectation : ~3 weeks  *Length of Case:   *Clearance needed: no  *Anticoagulation Instructions: N/A  *Aspirin Instructions: Ok to continue Aspirin  *Post-op visit Date/Instructions:  TBD  *Diagnosis: Bilateral ureteral stones  *Procedure: bilateral  Ureteroscopy w/laser lithotripsy & stent exchange (78295)   Additional orders: N/A  -Admit type: OUTpatient  -Anesthesia: General  -VTE Prophylaxis Standing Order SCD's       Other:   -Standing Lab Orders Per Anesthesia    Lab other: None  -Standing Test orders EKG/Chest x-ray per Anesthesia       Test other:   - Medications:  Ancef 2gm IV  -Other orders:  N/A

## 2023-12-29 NOTE — TOC CM/SW Note (Signed)
Transition of Care [facility]) - Inpatient Brief Assessment   Patient Details  Name: Lauren White MRN: [id number] Date of Birth: 06-09-1946  Transition of Care [facility]) CM/SW Contact:    Chapman Fitch, RN Phone Number: 12/29/2023, 11:52 AM   Clinical Narrative:    Transition of Care [facility]) Screening Note   Patient Details  Name: Lauren White Date of Birth: [birth year]   Transition of Care [facility]) CM/SW Contact:    Chapman Fitch, RN Phone Number: 12/29/2023, 11:52 AM    Transition of Care Department [facility]) has reviewed patient and no TOC needs have been identified at this time. . If new patient transition needs arise, please place a TOC consult.  Toc consult for home health needs.  No home health needs identified at this time.  Please consult TOC if needs arise    Transition of Care Asessment: Insurance and Status: Insurance coverage has been reviewed Patient has primary care physician: Yes     Prior/Current Home Services: No current home services Social Drivers of Health Review: SDOH reviewed no interventions necessary Readmission risk has been reviewed: Yes Transition of care needs: no transition of care needs at this time

## 2023-12-30 DIAGNOSIS — N139 Obstructive and reflux uropathy, unspecified: Secondary | ICD-10-CM | POA: Diagnosis not present

## 2023-12-30 LAB — URINE CULTURE

## 2023-12-30 LAB — BASIC METABOLIC PANEL
Anion gap: 8 (ref 5–15)
BUN: 14 mg/dL (ref 8–23)
CO2: 25 mmol/L (ref 22–32)
Calcium: 8.3 mg/dL — ABNORMAL LOW (ref 8.9–10.3)
Chloride: 108 mmol/L (ref 98–111)
Creatinine, Ser: 0.59 mg/dL (ref 0.44–1.00)
GFR, Estimated: >60 mL/min (ref >60)
Glucose, Bld: 100 mg/dL — ABNORMAL HIGH (ref 70–99)
Potassium: 3.2 mmol/L — ABNORMAL LOW (ref 3.5–5.1)
Sodium: 141 mmol/L (ref 135–145)

## 2023-12-30 LAB — CBC WITH DIFFERENTIAL/PLATELET
Abs Immature Granulocytes: 0.07 10*3/uL (ref 0.00–0.07)
Basophils Absolute: 0.1 10*3/uL (ref 0.0–0.1)
Basophils Relative: 0 %
Eosinophils Absolute: 0.2 10*3/uL (ref 0.0–0.5)
Eosinophils Relative: 1 %
HCT: 32 % — ABNORMAL LOW (ref 36.0–46.0)
Hemoglobin: 10.7 g/dL — ABNORMAL LOW (ref 12.0–15.0)
Immature Granulocytes: 1 %
Lymphocytes Relative: 20 %
Lymphs Abs: 2.5 10*3/uL (ref 0.7–4.0)
MCH: 28.9 pg (ref 26.0–34.0)
MCHC: 33.4 g/dL (ref 30.0–36.0)
MCV: 86.5 fL (ref 80.0–100.0)
Monocytes Absolute: 0.9 10*3/uL (ref 0.1–1.0)
Monocytes Relative: 7 %
Neutro Abs: 9.3 10*3/uL — ABNORMAL HIGH (ref 1.7–7.7)
Neutrophils Relative %: 71 %
Platelets: 203 10*3/uL (ref 150–400)
RBC: 3.7 MIL/uL — ABNORMAL LOW (ref 3.87–5.11)
RDW: 13.8 % (ref 11.5–15.5)
WBC: 13 10*3/uL — ABNORMAL HIGH (ref 4.0–10.5)
nRBC: 0 % (ref 0.0–0.2)

## 2023-12-30 LAB — GLUCOSE, CAPILLARY: Glucose-Capillary: 78 mg/dL (ref 70–99)

## 2023-12-30 MED ORDER — POTASSIUM CHLORIDE CRYS ER 20 MEQ PO TBCR
40.0000 meq | EXTENDED_RELEASE_TABLET | Freq: Once | ORAL | Status: AC
  Administered 2023-12-30: 40 meq via ORAL
  Filled 2023-12-30: qty 2

## 2023-12-30 MED ORDER — OXYBUTYNIN CHLORIDE 2.5 MG PO TABS: 2.5000 mg | ORAL_TABLET | Freq: Three times a day (TID) | ORAL | 1 refills | Status: DC | PRN

## 2023-12-30 MED ORDER — LEVOFLOXACIN 500 MG PO TABS: 500.0000 mg | ORAL_TABLET | Freq: Every day | ORAL | 0 refills | Status: AC

## 2023-12-30 NOTE — Discharge Summary (Signed)
 Physician Discharge Summary   Patient: Lauren White MRN: [id number] DOB: 30-Jan-1946  Admit date:     12/28/2023  Discharge date: 12/30/23  Discharge Physician: Delon Herald   PCP: Rudolpho Norleen BIRCH, MD   Recommendations at discharge:   Complete antibiotics (Levaquin  for 4 more days) Follow up with Dr. Rudolpho in 1-2 weeks Follow up with Dr. Twylla from urology in about 2 weeks Take Ditropan  every 8 hours as needed for bladder spasms  Discharge Diagnoses: Principal Problem:   Acute bilateral obstructive uropathy Active Problems:   Acute pyelonephritis   GERD (gastroesophageal reflux disease)   Hypothyroidism   Diabetes mellitus type 2, uncomplicated (HCC)   Generalized anxiety disorder   Hyperlipidemia   Hospital Course: 77yo with h/o hypothyroidism, HLD, and nephrolithiasis who presented with flank pain and was found to have obstructive uropathy with pyelonephritis.  She underwent cystoscopy with B ureteral stent placement on 12/29.  Assessment and Plan:  Acute bilateral obstructive uropathy with pyelonephritis Patient presenting with obstructive uropathy with infection Underwent cystoscopy with B ureteral stent placement on 12/29 Started on IV rocephin  for infectious coverage -> Levaquin  to complete 7 days Urine culture negative from admission; repeat cultures from procedure are also negative She will need definitive stone treatment with ureteroscopy/laser lithotripsy in about 2 weeks Blood cultures NTD x 2 days Ditropan  q8h prn bladder spasms    Diabetes mellitus type 2, uncomplicated  A1c 6.4, good control Diet controlled No needs for monitoring/treatment at this time   Hypothyroidism Continue Synthroid     GERD (gastroesophageal reflux disease) Continue PPI    HLD Continue atorvastatin , ASA Hold fish oil while inpatient   Anxiety Continue citalopram , melatonin       Consultants: Urology   Procedures: Cystoscopy with B ureteral stent  placement 12/29   Antibiotics: Ceftriaxone  x 1 dose Unasyn  12/30-31 Levaquin  500 mg daily x 4 days      Pain control - [place]  Controlled Substance Reporting System database was reviewed. and patient was instructed, not to drive, operate heavy machinery, perform activities at heights, swimming or participation in water activities or provide baby-sitting services while on Pain, Sleep and Anxiety Medications; until their outpatient Physician has advised to do so again. Also recommended to not to take more than prescribed Pain, Sleep and Anxiety Medications.   Disposition: Home Diet recommendation:  Regular diet DISCHARGE MEDICATION: Allergies as of 12/30/2023       Reactions   Diphenhydramine Hcl Swelling   Increases pressure in the eyes due to glaucoma   Etodolac Other (See Comments)   Chest pain   Omeprazole Other (See Comments)   Chest pain   Tramadol Other (See Comments)   Difficulty breathing   Nickel Rash   Povidone-iodine Rash        Medication List     TAKE these medications    aspirin  81 MG chewable tablet Chew 81 mg by mouth daily.   atorvastatin  20 MG tablet Commonly known as: LIPITOR Take 20 mg by mouth every evening.   citalopram  40 MG tablet Commonly known as: CELEXA  Take 40 mg by mouth daily.   FISH OIL PO Take 1,200 mg by mouth daily.   lansoprazole 15 MG capsule Commonly known as: PREVACID Take 15 mg by mouth daily at 12 noon.   levofloxacin  500 MG tablet Commonly known as: LEVAQUIN  Take 1 tablet (500 mg total) by mouth daily for 4 days.   levothyroxine  75 MCG tablet Commonly known as: SYNTHROID  Take 75 mcg by mouth  daily before breakfast.   Magnesium  Oxide 250 MG Tabs Take 250 mg by mouth daily.   melatonin 3 MG Tabs tablet Take 3 mg by mouth at bedtime as needed (sleep).   Multi-Vitamins Tabs Take 1 tablet by mouth daily.   naproxen sodium 220 MG tablet Commonly known as: ALEVE Take 220 mg by mouth daily as needed.    oxyBUTYnin  Chloride 2.5 MG Tabs Take 2.5 mg by mouth every 8 (eight) hours as needed for bladder spasms.        Discharge Exam:   Subjective: Still having some hematuria and pelvic discomfort.  Has been up and about without difficulty.  Eating/drinking ok.   Objective: Vitals:   12/30/23 0430 12/30/23 0826  BP: (!) 158/65 (!) 163/68  Pulse: 64 70  Resp: 18 18  Temp: 98.4 F (36.9 C) 98.3 F (36.8 C)  SpO2: 100% 98%   No intake or output data in the 24 hours ending 12/30/23 1158 Filed Weights   12/28/23 0000 12/28/23 2108  Weight: 53.5 kg 55.3 kg    Exam:  General:  Appears calm and comfortable and is in NAD Eyes:  EOMI, normal lids, iris ENT:  grossly normal hearing, lips & tongue, mmm Neck:  no LAD, masses or thyromegaly Cardiovascular:  RRR, no m/r/g. No LE edema.  Respiratory:   CTA bilaterally with no wheezes/rales/rhonchi.  Normal respiratory effort. Abdomen:  soft, vague suprapubic discomfort Back:   normal alignment, no CVAT Skin:  no rash or induration seen on limited exam Musculoskeletal:  grossly normal tone BUE/BLE, good ROM, no bony abnormality Psychiatric:  grossly normal mood and affect, speech fluent and appropriate, AOx3 Neurologic:  CN 2-12 grossly intact, moves all extremities in coordinated fashion  Data Reviewed: I have reviewed the patient's lab results since admission.  Pertinent labs for today include:   K+ 3.2 WBC 13 Hgb 10.7 Urine cultures negative    Condition at discharge: improving  The results of significant diagnostics from this hospitalization (including imaging, microbiology, ancillary and laboratory) are listed below for reference.   Imaging Studies: DG OR UROLOGY CYSTO IMAGE ([facility] ONLY) Result Date: 12/28/2023 There is no interpretation for this exam.  This order is for images obtained during a surgical procedure.  Please See Surgeries Tab for more information regarding the procedure.   CT Renal Stone Study Result  Date: 12/28/2023 CLINICAL DATA:  77 year old female with history of left-sided flank pain. EXAM: CT ABDOMEN AND PELVIS WITHOUT CONTRAST TECHNIQUE: Multidetector CT imaging of the abdomen and pelvis was performed following the standard protocol without IV contrast. RADIATION DOSE REDUCTION: This exam was performed according to the departmental dose-optimization program which includes automated exposure control, adjustment of the mA and/or kV according to patient size and/or use of iterative reconstruction technique. COMPARISON:  CT of the abdomen and pelvis 11/26/2022. FINDINGS: Lower chest: Moderate-sized hiatal hernia. Scattered areas of mild scarring in the visualize lung bases. Atherosclerotic calcifications in the descending thoracic aorta. Hepatobiliary: Low-attenuation lesions in the liver, incompletely characterized on today's noncontrast CT examination. The largest of these is very irregular in shape in the central aspect of the liver, predominantly centered in segments 4A, 4B and 5, with some peripheral calcifications, substantially smaller than prior study from 11/26/2022, presumably a partially involuted cyst. Pancreas: No definite pancreatic mass or peripancreatic fluid collections or inflammatory changes are noted on today's noncontrast CT examination. Spleen: Unremarkable. Adrenals/Urinary Tract: Nonobstructive calculi are noted within the collecting systems of both kidneys measuring up to 6 mm in  the upper pole of the right kidney. Severe right and moderate left hydroureteronephrosis. In the distal third of both ureters there are multiple calculi. The largest of these on the right measures 6 mm (axial image 58 of series 2) several cm before the right ureterovesicular junction, with an additional 4 mm calculus at the right ureterovesicular junction. On the left there is a cluster of multiple small stones just before the left ureterovesicular junction, largest of which measures 6 mm. Urinary bladder is  otherwise unremarkable. Bilateral adrenal glands are normal in appearance. Stomach/Bowel: Unenhanced appearance of the stomach is normal. No pathologic dilatation of small bowel or colon. Numerous colonic diverticuli are noted, particularly in the region of the sigmoid colon, without surrounding inflammatory changes to indicate an acute diverticulitis at this time. Status post appendectomy. Vascular/Lymphatic: Atherosclerotic calcifications in the abdominal aorta and pelvic vasculature. No lymphadenopathy noted in the abdomen or pelvis. Reproductive: Status post hysterectomy. Ovaries are not confidently identified may be surgically absent or atrophic. Other: No significant volume of ascites.  No pneumoperitoneum. Musculoskeletal: There are no aggressive appearing lytic or blastic lesions noted in the visualized portions of the skeleton. IMPRESSION: 1. In addition to multiple nonobstructive calculi within the collecting systems of both kidneys there are several large calculi in the distal third of both ureters at and before the ureterovesicular junctions resulting in severe right and moderate left hydroureteronephrosis. Urologic consultation is recommended. 2. Partial involution of previously noted large cystic lesion in the central aspect of the liver. This lesion has been present on numerous prior examinations dating back to 2008 and is presumably benign. 3. Colonic diverticulosis without evidence of acute diverticulitis at this time. 4. Moderate-sized hiatal hernia. 5. Aortic atherosclerosis. 6. Additional incidental findings, as above. Electronically Signed   By: Toribio Aye M.D.   On: 12/28/2023 05:19    Microbiology: Results for orders placed or performed during the hospital encounter of 12/28/23  Urine Culture     Status: None   Collection Time: 12/28/23 12:04 AM   Specimen: Urine, Clean Catch  Result Value Ref Range Status   Specimen Description   Final    URINE, CLEAN CATCH Performed at  [facility], [address] [place], [place] [address]    Special Requests   Final    NONE Performed at [facility], [address] [place], [place] [address]    Culture   Final    NO GROWTH Performed at [facility], [address] [place], [place] [address]    Report Status 12/29/2023 FINAL  Final  Culture, blood (Routine X 2) w Reflex to ID Panel     Status: None (Preliminary result)   Collection Time: 12/28/23  9:31 AM   Specimen: BLOOD  Result Value Ref Range Status   Specimen Description BLOOD RIGHT ANTECUBITAL  Final   Special Requests   Final    BOTTLES DRAWN AEROBIC AND ANAEROBIC Blood Culture adequate volume   Culture   Final    NO GROWTH 2 DAYS Performed at [facility], [address] [place], [place] [address]    Report Status PENDING  Incomplete  Culture, blood (Routine X 2) w Reflex to ID Panel     Status: None (Preliminary result)   Collection Time: 12/28/23  9:31 AM   Specimen: BLOOD LEFT HAND  Result Value Ref Range Status   Specimen Description BLOOD LEFT HAND  Final   Special Requests   Final    BOTTLES DRAWN  AEROBIC AND ANAEROBIC Blood Culture adequate volume   Culture   Final    NO GROWTH 2 DAYS Performed at [facility], [address] [place], [place] [address]    Report Status PENDING  Incomplete  Urine Culture     Status: None   Collection Time: 12/28/23  7:20 PM   Specimen: Urine, Cystoscope  Result Value Ref Range Status   Specimen Description   Final    URINE, RANDOM Performed at [facility], [address], [place], [place] [address]    Special Requests CYSTOSCOPY RIGHT RENAL PELVIS ID A  Final   Culture   Final    NO GROWTH Performed at [facility], [address], [place], [place] [address]    Report Status 12/30/2023 FINAL  Final  Urine Culture     Status: None   Collection Time: 12/28/23  7:24 PM   Specimen: Urine, Cystoscope  Result Value Ref Range Status    Specimen Description   Final    URINE, RANDOM Performed at [facility], [address] [place], [place] [address]    Special Requests CYSTOSCOPY LEFT RENAL PELVIS ID B  Final   Culture   Final    NO GROWTH Performed at [facility], [address] [place], [place] [address]    Report Status 12/30/2023 FINAL  Final    Labs: CBC: Recent Labs  Lab 12/28/23 0004 12/29/23 0446 12/30/23 0535  WBC 17.9* 11.5* 13.0*  NEUTROABS  --   --  9.3*  HGB 13.0 10.4* 10.7*  HCT 39.1 31.4* 32.0*  MCV 86.9 86.5 86.5  PLT 267 196 203   Basic Metabolic Panel: Recent Labs  Lab 12/28/23 0004 12/29/23 0446 12/30/23 0535  NA 134* 139 141  K 3.6 3.9 3.2*  CL 95* 106 108  CO2 24 26 25   GLUCOSE 198* 161* 100*  BUN 14 19 14   CREATININE 1.03* 0.74 0.59  CALCIUM  9.1 8.2* 8.3*   Liver Function Tests: Recent Labs  Lab 12/28/23 0004 12/29/23 0446  AST 31 38  ALT 18 29  ALKPHOS 200* 150*  BILITOT 0.7 0.4  PROT 7.6 5.9*  ALBUMIN 4.0 2.9*   CBG: Recent Labs  Lab 12/29/23 0842 12/29/23 1145 12/29/23 1640 12/29/23 2154 12/30/23 0825  GLUCAP 120* 175* 111* 113* 78    Discharge time spent: greater than 30 minutes.  Signed: Delon Herald, MD [facility] 12/30/2023

## 2023-12-30 NOTE — Progress Notes (Signed)
 IV removed without complications. Daughter assisting patient to get dressed.  Lauren White Lauren White

## 2023-12-30 NOTE — Plan of Care (Signed)

## 2024-01-02 LAB — CULTURE, BLOOD (ROUTINE X 2)
Culture: NO GROWTH
Culture: NO GROWTH
Special Requests: ADEQUATE
Special Requests: ADEQUATE

## 2024-01-06 ENCOUNTER — Telehealth: Payer: Self-pay

## 2024-01-06 NOTE — Telephone Encounter (Signed)
  Per Dr. Penne, Patient is to be scheduled for  Bilateral Ureteroscopy with Laser Lithotripsy and Stent Exchange   Lauren White was contacted and possible surgical dates were discussed, Monday January 20th, 2025 was agreed upon for surgery.   Patient was directed to call [phone] between 1-3pm the day before surgery to find out surgical arrival time.  Instructions were given not to eat or drink from midnight on the night before surgery and have a driver for the day of surgery. On the surgery day patient was instructed to enter through the Medical Mall entrance of [facility] report the Same Day Surgery desk.   Pre-Admit Testing will be in contact via phone to set up an interview with the anesthesia team to review your history and medications prior to surgery.   Reminder of this information was sent via MyChart to the patient.

## 2024-01-13 ENCOUNTER — Encounter
Admission: RE | Admit: 2024-01-13 | Discharge: 2024-01-13 | Disposition: A | Discharge status: Home / Self Care | Payer: Medicare Other | Source: Ambulatory Visit | Attending: Urology | Admitting: Urology

## 2024-01-13 VITALS — Ht 60.5 in | Wt 114.0 lb

## 2024-01-13 DIAGNOSIS — E7800 Pure hypercholesterolemia, unspecified: Secondary | ICD-10-CM

## 2024-01-13 DIAGNOSIS — I480 Paroxysmal atrial fibrillation: Secondary | ICD-10-CM

## 2024-01-13 DIAGNOSIS — I471 Supraventricular tachycardia, unspecified: Secondary | ICD-10-CM

## 2024-01-13 DIAGNOSIS — Z01812 Encounter for preprocedural laboratory examination: Secondary | ICD-10-CM

## 2024-01-13 DIAGNOSIS — E119 Type 2 diabetes mellitus without complications: Secondary | ICD-10-CM

## 2024-01-13 DIAGNOSIS — Z01810 Encounter for preprocedural cardiovascular examination: Secondary | ICD-10-CM

## 2024-01-13 DIAGNOSIS — K769 Liver disease, unspecified: Secondary | ICD-10-CM

## 2024-01-13 HISTORY — DX: Type 2 diabetes mellitus without complications: E11.9

## 2024-01-13 HISTORY — DX: Obstruction of bile duct: K83.1

## 2024-01-13 HISTORY — DX: Occlusion and stenosis of bilateral carotid arteries: I65.23

## 2024-01-13 HISTORY — DX: Paroxysmal atrial fibrillation: I48.0

## 2024-01-13 HISTORY — DX: Gastro-esophageal reflux disease without esophagitis: K21.9

## 2024-01-13 HISTORY — DX: Anemia, unspecified: D64.9

## 2024-01-13 HISTORY — DX: Unspecified cataract: H26.9

## 2024-01-13 NOTE — Patient Instructions (Addendum)
 Your procedure is scheduled on: Monday, January 20 Report to the Registration Desk on the 1st floor of the [facility] To find out your arrival time, please call [phone] between 1PM - 3PM on: Friday, January 17 If your arrival time is 6:00 am, do not arrive before that time as the Medical Mall entrance doors do not open until 6:00 am.  REMEMBER: Instructions that are not followed completely may result in serious medical risk, up to and including death; or upon the discretion of your surgeon and anesthesiologist your surgery may need to be rescheduled.  Do not eat or drink after midnight the night before surgery.  No gum chewing or hard candies.  One week prior to surgery:  Stop Anti-inflammatories (NSAIDS) such as Advil, Aleve, Ibuprofen, Motrin, Naproxen, Naprosyn and Aspirin  based products such as Excedrin, Goody's Powder, BC Powder. Stop ANY OVER THE COUNTER supplements until after surgery. Stop melatonin, fish oil, multiple vitamins.  You may however, continue to take Tylenol  if needed for pain up until the day of surgery.  Per Dr. Penne: you may continue taking the aspirin .  Continue taking all of your other prescription medications up until the day of surgery.  ON THE DAY OF SURGERY ONLY TAKE THESE MEDICATIONS WITH SIPS OF WATER:  citalopram  (CELEXA )  lansoprazole (PREVACID) levothyroxine  (SYNTHROID )  No Alcohol for 24 hours before or after surgery.  No Smoking including e-cigarettes for 24 hours before surgery.  No chewable tobacco products for at least 6 hours before surgery.  No nicotine patches on the day of surgery.  Do not use any recreational drugs for at least a week (preferably 2 weeks) before your surgery.  Please be advised that the combination of cocaine and anesthesia may have negative outcomes, up to and including death. If you test positive for cocaine, your surgery will be cancelled.  On the morning of surgery brush your teeth with toothpaste  and water, you may rinse your mouth with mouthwash if you wish. Do not swallow any toothpaste or mouthwash.  Do not wear jewelry, make-up, hairpins, clips or nail polish.  For welded (permanent) jewelry: bracelets, anklets, waist bands, etc.  Please have this removed prior to surgery.  If it is not removed, there is a chance that hospital personnel will need to cut it off on the day of surgery.  Do not wear lotions, powders, or perfumes.   Do not shave body hair from the neck down 48 hours before surgery.  Contact lenses, hearing aids and dentures may not be worn into surgery.  Do not bring valuables to the hospital. [facility] is not responsible for any missing/lost belongings or valuables.   Notify your doctor if there is any change in your medical condition (cold, fever, infection).  Wear comfortable clothing (specific to your surgery type) to the hospital.  After surgery, you can help prevent lung complications by doing breathing exercises.  Take deep breaths and cough every 1-2 hours.   If you are being discharged the day of surgery, you will not be allowed to drive home. You will need a responsible individual to drive you home and stay with you for 24 hours after surgery.   If you are taking public transportation, you will need to have a responsible individual with you.  Please call the Pre-admissions Testing Dept. at [phone] if you have any questions about these instructions.  Surgery Visitation Policy:  Patients having surgery or a procedure may have two visitors.  Children under the  age of 51 must have an adult with them who is not the patient.  Temporary Visitor Restrictions Due to increasing cases of flu, RSV and COVID-19: Children ages 72 and under will not be able to visit patients in [facility] hospitals under most circumstances.

## 2024-01-14 ENCOUNTER — Encounter
Admission: RE | Admit: 2024-01-14 | Discharge: 2024-01-14 | Disposition: A | Discharge status: Home / Self Care | Payer: Medicare Other | Source: Ambulatory Visit | Attending: Urology | Admitting: Urology

## 2024-01-14 ENCOUNTER — Other Ambulatory Visit: Payer: Self-pay

## 2024-01-14 DIAGNOSIS — K769 Liver disease, unspecified: Secondary | ICD-10-CM | POA: Insufficient documentation

## 2024-01-14 DIAGNOSIS — I471 Supraventricular tachycardia, unspecified: Secondary | ICD-10-CM | POA: Insufficient documentation

## 2024-01-14 DIAGNOSIS — Z01818 Encounter for other preprocedural examination: Secondary | ICD-10-CM | POA: Insufficient documentation

## 2024-01-14 DIAGNOSIS — Z01812 Encounter for preprocedural laboratory examination: Secondary | ICD-10-CM

## 2024-01-14 DIAGNOSIS — I480 Paroxysmal atrial fibrillation: Secondary | ICD-10-CM | POA: Diagnosis not present

## 2024-01-14 DIAGNOSIS — Z01810 Encounter for preprocedural cardiovascular examination: Secondary | ICD-10-CM

## 2024-01-14 DIAGNOSIS — E119 Type 2 diabetes mellitus without complications: Secondary | ICD-10-CM | POA: Insufficient documentation

## 2024-01-14 DIAGNOSIS — E7800 Pure hypercholesterolemia, unspecified: Secondary | ICD-10-CM | POA: Insufficient documentation

## 2024-01-14 LAB — CBC
HCT: 36 % (ref 36.0–46.0)
Hemoglobin: 11.7 g/dL — ABNORMAL LOW (ref 12.0–15.0)
MCH: 28.3 pg (ref 26.0–34.0)
MCHC: 32.5 g/dL (ref 30.0–36.0)
MCV: 87 fL (ref 80.0–100.0)
Platelets: 271 10*3/uL (ref 150–400)
RBC: 4.14 MIL/uL (ref 3.87–5.11)
RDW: 13.9 % (ref 11.5–15.5)
WBC: 9.7 10*3/uL (ref 4.0–10.5)
nRBC: 0 % (ref 0.0–0.2)

## 2024-01-14 LAB — BASIC METABOLIC PANEL
Anion gap: 9 (ref 5–15)
BUN: 15 mg/dL (ref 8–23)
CO2: 25 mmol/L (ref 22–32)
Calcium: 8.7 mg/dL — ABNORMAL LOW (ref 8.9–10.3)
Chloride: 103 mmol/L (ref 98–111)
Creatinine, Ser: 0.68 mg/dL (ref 0.44–1.00)
GFR, Estimated: >60 mL/min (ref >60)
Glucose, Bld: 131 mg/dL — ABNORMAL HIGH (ref 70–99)
Potassium: 3.7 mmol/L (ref 3.5–5.1)
Sodium: 137 mmol/L (ref 135–145)

## 2024-01-18 MED ORDER — SODIUM CHLORIDE 0.9 % IV SOLN: INTRAVENOUS | Status: DC

## 2024-01-18 MED ORDER — CHLORHEXIDINE GLUCONATE 0.12 % MT SOLN
15.0000 mL | Freq: Once | OROMUCOSAL | Status: AC
  Administered 2024-01-19: 15 mL via OROMUCOSAL

## 2024-01-18 MED ORDER — CEFAZOLIN SODIUM-DEXTROSE 2-4 GM/100ML-% IV SOLN
2.0000 g | INTRAVENOUS | Status: AC
  Administered 2024-01-19: 2 g via INTRAVENOUS

## 2024-01-18 MED ORDER — ORAL CARE MOUTH RINSE: 15.0000 mL | Freq: Once | OROMUCOSAL | Status: AC

## 2024-01-19 ENCOUNTER — Encounter
Admission: RE | Disposition: A | Discharge status: Home / Self Care | Payer: Self-pay | Source: Home / Self Care | Attending: Urology

## 2024-01-19 ENCOUNTER — Ambulatory Visit: Payer: Medicare Other | Admitting: Urgent Care

## 2024-01-19 ENCOUNTER — Encounter: Payer: Self-pay | Admitting: Urology

## 2024-01-19 ENCOUNTER — Ambulatory Visit
Admission: RE | Admit: 2024-01-19 | Discharge: 2024-01-19 | Disposition: A | Discharge status: Home / Self Care | Payer: Medicare Other | Attending: Urology | Admitting: Urology

## 2024-01-19 ENCOUNTER — Ambulatory Visit: Payer: Medicare Other

## 2024-01-19 ENCOUNTER — Other Ambulatory Visit: Payer: Self-pay

## 2024-01-19 DIAGNOSIS — N132 Hydronephrosis with renal and ureteral calculous obstruction: Secondary | ICD-10-CM | POA: Insufficient documentation

## 2024-01-19 DIAGNOSIS — Z87442 Personal history of urinary calculi: Secondary | ICD-10-CM | POA: Insufficient documentation

## 2024-01-19 DIAGNOSIS — N201 Calculus of ureter: Secondary | ICD-10-CM

## 2024-01-19 DIAGNOSIS — K449 Diaphragmatic hernia without obstruction or gangrene: Secondary | ICD-10-CM | POA: Diagnosis not present

## 2024-01-19 DIAGNOSIS — Z8619 Personal history of other infectious and parasitic diseases: Secondary | ICD-10-CM | POA: Diagnosis not present

## 2024-01-19 DIAGNOSIS — N10 Acute tubulo-interstitial nephritis: Secondary | ICD-10-CM | POA: Insufficient documentation

## 2024-01-19 DIAGNOSIS — N202 Calculus of kidney with calculus of ureter: Secondary | ICD-10-CM | POA: Diagnosis present

## 2024-01-19 DIAGNOSIS — D72829 Elevated white blood cell count, unspecified: Secondary | ICD-10-CM | POA: Insufficient documentation

## 2024-01-19 DIAGNOSIS — Z87891 Personal history of nicotine dependence: Secondary | ICD-10-CM | POA: Diagnosis not present

## 2024-01-19 DIAGNOSIS — I700 Atherosclerosis of aorta: Secondary | ICD-10-CM | POA: Diagnosis not present

## 2024-01-19 DIAGNOSIS — Z01812 Encounter for preprocedural laboratory examination: Secondary | ICD-10-CM

## 2024-01-19 DIAGNOSIS — E119 Type 2 diabetes mellitus without complications: Secondary | ICD-10-CM

## 2024-01-19 HISTORY — PX: CYSTOSCOPY/URETEROSCOPY/HOLMIUM LASER/STENT PLACEMENT: SHX6546

## 2024-01-19 LAB — GLUCOSE, CAPILLARY
Glucose-Capillary: 104 mg/dL — ABNORMAL HIGH (ref 70–99)
Glucose-Capillary: 102 mg/dL — ABNORMAL HIGH (ref 70–99)

## 2024-01-19 SURGERY — CYSTOSCOPY/URETEROSCOPY/HOLMIUM LASER/STENT PLACEMENT
Anesthesia: General | Site: Ureter | Laterality: Bilateral

## 2024-01-19 MED ORDER — EPHEDRINE 5 MG/ML INJ
INTRAVENOUS | Status: AC
  Filled 2024-01-19: qty 5

## 2024-01-19 MED ORDER — CHLORHEXIDINE GLUCONATE 0.12 % MT SOLN
OROMUCOSAL | Status: AC
  Filled 2024-01-19: qty 15

## 2024-01-19 MED ORDER — FENTANYL CITRATE (PF) 100 MCG/2ML IJ SOLN
INTRAMUSCULAR | Status: AC
  Filled 2024-01-19: qty 2

## 2024-01-19 MED ORDER — ACETAMINOPHEN 10 MG/ML IV SOLN
INTRAVENOUS | Status: AC
  Filled 2024-01-19: qty 100

## 2024-01-19 MED ORDER — ONDANSETRON HCL 4 MG/2ML IJ SOLN
INTRAMUSCULAR | Status: DC | PRN
  Administered 2024-01-19: 4 mg via INTRAVENOUS

## 2024-01-19 MED ORDER — CEFAZOLIN SODIUM-DEXTROSE 2-4 GM/100ML-% IV SOLN
INTRAVENOUS | Status: AC
  Filled 2024-01-19: qty 100

## 2024-01-19 MED ORDER — LIDOCAINE HCL (CARDIAC) PF 100 MG/5ML IV SOSY
PREFILLED_SYRINGE | INTRAVENOUS | Status: DC | PRN
  Administered 2024-01-19: 60 mg via INTRAVENOUS

## 2024-01-19 MED ORDER — METOPROLOL TARTRATE 5 MG/5ML IV SOLN
INTRAVENOUS | Status: DC | PRN
  Administered 2024-01-19: 2 mg via INTRAVENOUS

## 2024-01-19 MED ORDER — SODIUM CHLORIDE 0.9 % IR SOLN
Status: DC | PRN
  Administered 2024-01-19: 3000 mL

## 2024-01-19 MED ORDER — FENTANYL CITRATE (PF) 100 MCG/2ML IJ SOLN
INTRAMUSCULAR | Status: DC | PRN
  Administered 2024-01-19: 100 ug via INTRAVENOUS

## 2024-01-19 MED ORDER — ACETAMINOPHEN 10 MG/ML IV SOLN
INTRAVENOUS | Status: DC | PRN
  Administered 2024-01-19: 1000 mg via INTRAVENOUS

## 2024-01-19 MED ORDER — PHENYLEPHRINE 80 MCG/ML (10ML) SYRINGE FOR IV PUSH (FOR BLOOD PRESSURE SUPPORT)
PREFILLED_SYRINGE | INTRAVENOUS | Status: DC | PRN
  Administered 2024-01-19: 80 ug via INTRAVENOUS

## 2024-01-19 MED ORDER — ROCURONIUM BROMIDE 100 MG/10ML IV SOLN
INTRAVENOUS | Status: DC | PRN
  Administered 2024-01-19: 30 mg via INTRAVENOUS

## 2024-01-19 MED ORDER — IOHEXOL 180 MG/ML  SOLN
INTRAMUSCULAR | Status: DC | PRN
  Administered 2024-01-19: 20 mL

## 2024-01-19 MED ORDER — EPHEDRINE SULFATE-NACL 50-0.9 MG/10ML-% IV SOSY
PREFILLED_SYRINGE | INTRAVENOUS | Status: DC | PRN
  Administered 2024-01-19: 5 mg via INTRAVENOUS

## 2024-01-19 MED ORDER — SUGAMMADEX SODIUM 200 MG/2ML IV SOLN
INTRAVENOUS | Status: DC | PRN
  Administered 2024-01-19: 120 mg via INTRAVENOUS

## 2024-01-19 MED ORDER — PROPOFOL 10 MG/ML IV BOLUS
INTRAVENOUS | Status: DC | PRN
  Administered 2024-01-19: 100 mg via INTRAVENOUS

## 2024-01-19 MED ORDER — DEXAMETHASONE SODIUM PHOSPHATE 10 MG/ML IJ SOLN
INTRAMUSCULAR | Status: DC | PRN
  Administered 2024-01-19: 5 mg via INTRAVENOUS

## 2024-01-19 SURGICAL SUPPLY — 24 items
ADHESIVE MASTISOL STRL (MISCELLANEOUS) IMPLANT
BAG DRAIN SIEMENS DORNER NS (MISCELLANEOUS) ×1 IMPLANT
BASKET ZERO TIP 1.9FR (BASKET) IMPLANT
BRUSH SCRUB EZ 1% IODOPHOR (MISCELLANEOUS) IMPLANT
CATH URET FLEX-TIP 2 LUMEN 10F (CATHETERS) IMPLANT
CATH URETL OPEN 5X70 (CATHETERS) ×1 IMPLANT
CNTNR URN SCR LID CUP LEK RST (MISCELLANEOUS) IMPLANT
DRAPE UTILITY 15X26 TOWEL STRL (DRAPES) IMPLANT
DRSG TEGADERM 2-3/8X2-3/4 SM (GAUZE/BANDAGES/DRESSINGS) IMPLANT
FIBER LASER MOSES 200 DFL (Laser) IMPLANT
GLOVE BIO SURGEON STRL SZ 6.5 (GLOVE) ×1 IMPLANT
GOWN STRL REUS W/ TWL LRG LVL3 (GOWN DISPOSABLE) ×2 IMPLANT
GUIDEWIRE GREEN .038 145CM (MISCELLANEOUS) IMPLANT
GUIDEWIRE STR DUAL SENSOR (WIRE) ×1 IMPLANT
IV NS IRRIG 3000ML ARTHROMATIC (IV SOLUTION) ×1 IMPLANT
KIT TURNOVER CYSTO (KITS) ×1 IMPLANT
PACK CYSTO AR (MISCELLANEOUS) ×1 IMPLANT
SET CYSTO W/LG BORE CLAMP LF (SET/KITS/TRAYS/PACK) ×1 IMPLANT
SHEATH NAVIGATOR HD 12/14X36 (SHEATH) IMPLANT
STENT URET 6FRX22 CONTOUR (STENTS) IMPLANT
STENT URET 6FRX24 CONTOUR (STENTS) IMPLANT
STENT URET 6FRX26 CONTOUR (STENTS) IMPLANT
SURGILUBE 2OZ TUBE FLIPTOP (MISCELLANEOUS) ×1 IMPLANT
WATER STERILE IRR 500ML POUR (IV SOLUTION) ×1 IMPLANT

## 2024-01-19 NOTE — Anesthesia Procedure Notes (Signed)
Procedure Name: Intubation Date/Time: 01/19/2024 11:49 AM  Performed by: Maryla Morrow., CRNAPre-anesthesia Checklist: Patient identified, Patient being monitored, Timeout performed, Emergency Drugs available and Suction available Patient Re-evaluated:Patient Re-evaluated prior to induction Oxygen Delivery Method: Circle system utilized Preoxygenation: Pre-oxygenation with 100% oxygen Induction Type: IV induction Ventilation: Mask ventilation without difficulty Laryngoscope Size: 3 and McGrath Grade View: Grade I Tube type: Oral Tube size: 7.0 mm Number of attempts: 1 Airway Equipment and Method: Stylet Placement Confirmation: ETT inserted through vocal cords under direct vision, positive ETCO2 and breath sounds checked- equal and bilateral Secured at: 21 cm Tube secured with: Tape Dental Injury: Teeth and Oropharynx as per pre-operative assessment

## 2024-01-19 NOTE — Anesthesia Preprocedure Evaluation (Addendum)
Anesthesia Evaluation  Patient identified by MRN, date of birth, ID band Patient awake    Reviewed: Allergy & Precautions, NPO status , Patient's Chart, lab work & pertinent test results  History of Anesthesia Complications Negative for: history of anesthetic complications  Airway Mallampati: III  TM Distance: >3 FB Neck ROM: full    Dental  (+) Edentulous Upper, Edentulous Lower   Pulmonary former smoker   Pulmonary exam normal        Cardiovascular + Peripheral Vascular Disease  Normal cardiovascular exam+ dysrhythmias (s/p ablation) Atrial Fibrillation and Supra Ventricular Tachycardia      Neuro/Psych  PSYCHIATRIC DISORDERS Anxiety Depression    negative neurological ROS     GI/Hepatic Neg liver ROS, PUD,GERD  ,,  Endo/Other  diabetesHypothyroidism    Renal/GU      Musculoskeletal   Abdominal   Peds  Hematology  (+) Blood dyscrasia, anemia   Anesthesia Other Findings Past Medical History: 11/2023: Acute bilateral obstructive uropathy No date: Anemia No date: Anxiety No date: Arthritis No date: Bilateral carotid artery stenosis No date: Biliary obstruction 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 No date: Cataracts, bilateral No date: Depression No date: Diabetes mellitus type 2, uncomplicated (HCC) No date: Dysrhythmia     Comment:  HISTORY OF SVT, performed a cardiac ablation 2014: Gastric ulcer due to Helicobacter pylori No date: GERD (gastroesophageal reflux disease) 04/07/2021: Hematuria 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 No date: History of kidney stones No date: Hyperlipidemia No date: Hypothyroidism 2016: Lesion of liver No date: Paroxysmal atrial fibrillation (HCC) 08/24/2015: Personal history of tobacco use, presenting hazards to  health 2014: SVT (supraventricular tachycardia) (HCC)  Past Surgical History: 1984: ABDOMINAL HYSTERECTOMY 1973: APPENDECTOMY 2014: CARDIAC ELECTROPHYSIOLOGY MAPPING AND  ABLATION 04/16/2023: CHOLECYSTECTOMY, LAPAROSCOPIC     Comment:  MARSUPIALIZATION OF CYST OR ABSCESS OF LIVER 2008: COLON SURGERY     Comment:  mass removed on outside of colon - benign 2008: COLONOSCOPY WITH ESOPHAGOGASTRODUODENOSCOPY (EGD) 04/12/2021: CYSTOSCOPY W/ RETROGRADES; Bilateral     Comment:  Procedure: CYSTOSCOPY WITH RETROGRADE PYELOGRAM;                Surgeon: Vanna Scotland, MD;  Location: [facility];                Service: Urology;  Laterality: Bilateral; 12/28/2023: CYSTOSCOPY W/ URETERAL STENT PLACEMENT; Bilateral     Comment:  Procedure: CYSTOSCOPY WITH STENT PLACEMENT;  Surgeon:               Riki Altes, MD;  Location: [facility];  Service:               Urology;  Laterality: Bilateral; 04/12/2021: CYSTOSCOPY/URETEROSCOPY/HOLMIUM LASER/STENT PLACEMENT;  Left     Comment:  Procedure: CYSTOSCOPY/URETEROSCOPY/HOLMIUM LASER/STENT               PLACEMENT;  Surgeon: Vanna Scotland, MD;  Location: [facility]              ORS;  Service: Urology;  Laterality: Left; 03/13/2021: EYE SURGERY; Left     Comment:  cataract and vitrectomy 2012: TRIGGER FINGER RELEASE; Right     Comment:  middle finger 04/12/2021: URETEROSCOPY; Right     Comment:  Procedure: URETEROSCOPY;  Surgeon: Vanna Scotland, MD;               Location: [facility];  Service: Urology;  Laterality:               Right;  BMI    Body Mass Index: 22.26 kg/m  Reproductive/Obstetrics negative OB ROS                             Anesthesia Physical Anesthesia Plan  ASA: 3  Anesthesia Plan: General ETT   Post-op Pain Management: Toradol IV (intra-op)* and Ofirmev IV (intra-op)*   Induction: Intravenous  PONV Risk Score and Plan: 3 and Ondansetron, Dexamethasone, Midazolam and Treatment may vary due to age or medical condition  Airway Management Planned: Oral ETT  Additional Equipment:   Intra-op Plan:   Post-operative Plan: Extubation in OR  Informed Consent: I have  reviewed the patients History and Physical, chart, labs and discussed the procedure including the risks, benefits and alternatives for the proposed anesthesia with the patient or authorized representative who has indicated his/her understanding and acceptance.     Dental Advisory Given  Plan Discussed with: Anesthesiologist, CRNA and Surgeon  Anesthesia Plan Comments: (Patient consented for risks of anesthesia including but not limited to:  - adverse reactions to medications - damage to eyes, teeth, lips or other oral mucosa - nerve damage due to positioning  - sore throat or hoarseness - Damage to heart, brain, nerves, lungs, other parts of body or loss of life  Patient voiced understanding and assent.)       Anesthesia Quick Evaluation

## 2024-01-19 NOTE — Discharge Instructions (Signed)
You have a ureteral stent in place.  This is a tube that extends from your kidney to your bladder.  This may cause urinary bleeding, burning with urination, and urinary frequency.  Please call our office or present to the ED if you develop fevers >101 or pain which is not able to be controlled with oral pain medications.  You may be given either Flomax and/ or ditropan to help with bladder spasms and stent pain in addition to pain medications.    [facility] [address], [address] [place], [place] [address] [phone] 

## 2024-01-19 NOTE — Interval H&P Note (Signed)
History and Physical Interval Note:  01/19/2024 11:18 AM  Lawson Fiscal  has presented today for surgery, with the diagnosis of Bilateral Ureteral Stones.  The various methods of treatment have been discussed with the patient and family. After consideration of risks, benefits and other options for treatment, the patient has consented to  Procedure(s): CYSTOSCOPY/URETEROSCOPY/HOLMIUM LASER/STENT EXCHANGE (Bilateral) as a surgical intervention.  The patient's history has been reviewed, patient examined, no change in status, stable for surgery.  I have reviewed the patient's chart and labs.  Questions were answered to the patient's satisfaction.    She returns today for definitive management of her stone.  Infection treated during inpatient admission and upon discharge.  Regular rate and rhythm Clear to auscultation bilaterally   Lauren White

## 2024-01-19 NOTE — Op Note (Signed)
Date of procedure: 01/19/24  Preoperative diagnosis:  Bilateral ureteral stones history of sepsis of presumed urinary source Kidney stones  Postoperative diagnosis:  Same as above  Procedure: Bilateral ureteroscopy Laser lithotripsy Bilateral ureteral stent exchange Retrograde pyelogram (bilateral) Interpretation of fluoroscopy less than 30 minutes  Surgeon: Vanna Scotland, MD  Anesthesia: General  Complications: None  Intraoperative findings: Bilateral distal ureteral calculi identified and treated.  Additional upper tract stone burden which was partially embedded was also treated.  No complications.  EBL: Minimal  Specimens: Stone fragment  Drains: 6 x 22 French double-J ureteral stents bilaterally  Indication: Lauren White is a 78 y.o. patient with personal history of obstructing ureteral stones who underwent emergent ureteral stent placement in the setting of sepsis-like picture.  She returns today for definitive management of her stone.  After reviewing the management options for treatment, she/her/hers  elected to proceed with the above surgical procedure(s). We have discussed the potential benefits and risks of the procedure, side effects of the proposed treatment, the likelihood of the patient achieving the goals of the procedure, and any potential problems that might occur during the procedure or recuperation. Informed consent has been obtained.  Description of procedure:  The patient was taken to the operating room and general anesthesia was induced.  The patient was placed in the dorsal lithotomy position, prepped and draped in the usual sterile fashion, and preoperative antibiotics were administered. A preoperative time-out was performed.    A 21 French scope was advanced per urethra into the bladder.  Attention was turned to the left ureteral orifice from which a ureteral stent was seen emanating.  The distal coil of the stent was grasped and brought to  level urethral meatus.  The stent was cannulated up to the level of the kidney and the stent was removed leaving the wire in place.  The sensor wire was snapped in place as a safety wire.  A semirigid ureteroscope was advanced into the distal ureter where multiple stones were encountered..  A 200 m laser fiber was then brought in using dusting settings of 0.3 J and 80 Hz, the stone was dusted.  All fragments were removed.  The scope was advanced all the way up to the proximal ureter no additional stones were identified there is no ureteral injuries appreciated.    Next, attention was turned to the right side.  The stent was grasped brought to the level of the urethral meatus.  This was also cannulated up to the level of the kidney and snapped in place as a safety wire.  A semirigid ureteroscope was then brought on this side and the same procedure was used to treat the distal stones on this side.  The scope was then advanced all the way up to the proximal ureter no additional stone fragments were identified.  I then advanced a Super Stiff wire under direct visualization up to the level of the kidney.  This was used as a working wire.  A ureteral access sheath is advanced over the working wire to the proximal ureter under fluoroscopy, with 12/14 in diameter.  Once this was in place, a digital flexible ureteroscope was then brought in.  Each calyx was identified.  A stone which was partially submucosal was identified and upper pole calyx.  The laser was used to dust this fairly large stone into tiny particles.  Some of the mucosa overlying was also removed to unroofed the stone.  At the end of the procedure, there was  no stones greater than the large of the size of the tip of the laser fiber.  The scope was then backed down the length the ureter inspecting along the way and removing the access sheath at the same time.  There is no ureteral injuries appreciated and no additional residual stone burden.  Attention  was turned back to the left side.  A dual-lumen access sheath was used to introduce a Super Stiff wire up to the level of the kidney.  The ureteral access sheath was then advanced up the side which also went easily.  The cyst flexible digital ureteroscope was used to treat again a very similar nonobstructing stone which was partially embedded on this side as well.  No additional stone fragments were identified.  A final retrograde pyelogram on this side showed no contrast extravasation.  The scope was backed down the length the ureter inspecting along the way.  There is no ureteral injuries appreciated no additional stone fragments.  On both sides, 6 x 22 French double-J ureteral stent's were placed fluoroscopically.  Once the wires were removed, there was a full coil noted both within the bladder as well as the within the renal pelvis.  The bladder was then drained.  The patient was cleaned and dried, repositioned the supine position, reversed from anesthesia, and taken to the PACU in stable condition.  Plan: She will return next week for cystoscopy, stent removal.  She is interested in learning how to prevent stones in the future.  Vanna Scotland, M.D.

## 2024-01-19 NOTE — Transfer of Care (Signed)
Immediate Anesthesia Transfer of Care Note  Patient: Lauren White  Procedure(s) Performed: CYSTOSCOPY/URETEROSCOPY/HOLMIUM LASER/STENT EXCHANGE (Bilateral: Ureter)  Patient Location: PACU  Anesthesia Type:General  Level of Consciousness: drowsy  Airway & Oxygen Therapy: Patient Spontanous Breathing and Patient connected to face mask oxygen  Post-op Assessment: Report given to RN and Post -op Vital signs reviewed and stable  Post vital signs: Reviewed  Last Vitals:  Vitals Value Taken Time  BP 174/63 01/19/24 1248  Temp    Pulse 80 01/19/24 1250  Resp 14 01/19/24 1250  SpO2 100 % 01/19/24 1250  Vitals shown include unfiled device data.  Last Pain:         Complications: No notable events documented.

## 2024-01-20 ENCOUNTER — Encounter: Payer: Self-pay | Admitting: Urology

## 2024-01-20 NOTE — Anesthesia Postprocedure Evaluation (Signed)
Anesthesia Post Note  Patient: Lauren White  Procedure(s) Performed: CYSTOSCOPY/URETEROSCOPY/HOLMIUM LASER/STENT EXCHANGE (Bilateral: Ureter)  Patient location during evaluation: PACU Anesthesia Type: General Level of consciousness: awake and alert Pain management: pain level controlled Vital Signs Assessment: post-procedure vital signs reviewed and stable Respiratory status: spontaneous breathing, nonlabored ventilation, respiratory function stable and patient connected to nasal cannula oxygen Cardiovascular status: blood pressure returned to baseline and stable Postop Assessment: no apparent nausea or vomiting Anesthetic complications: no   There were no known notable events for this encounter.   Last Vitals:  Vitals:   01/19/24 1345 01/19/24 1403  BP: (!) 144/63 (!) 150/70  Pulse: 72 73  Resp: 12 15  Temp:  36.6 C  SpO2: 96% 95%    Last Pain:  Vitals:   01/19/24 1403  TempSrc: Temporal  PainSc: 0-No pain                 Louie Boston

## 2024-01-23 LAB — CALCULI, WITH PHOTOGRAPH (CLINICAL LAB)
Calcium Oxalate Dihydrate: 60 %
Calcium Oxalate Monohydrate: 35 %
Hydroxyapatite: 5 %
Weight Calculi: 14 mg

## 2024-01-27 ENCOUNTER — Ambulatory Visit: Payer: Medicare Other | Admitting: Urology

## 2024-01-27 VITALS — BP 113/66 | HR 97 | Ht 60.5 in | Wt 116.0 lb

## 2024-01-27 DIAGNOSIS — Z466 Encounter for fitting and adjustment of urinary device: Secondary | ICD-10-CM | POA: Diagnosis not present

## 2024-01-27 DIAGNOSIS — N201 Calculus of ureter: Secondary | ICD-10-CM

## 2024-01-27 DIAGNOSIS — Z792 Long term (current) use of antibiotics: Secondary | ICD-10-CM

## 2024-01-27 DIAGNOSIS — N139 Obstructive and reflux uropathy, unspecified: Secondary | ICD-10-CM

## 2024-01-27 LAB — URINALYSIS, COMPLETE
Bilirubin, UA: NEGATIVE
Nitrite, UA: NEGATIVE
Specific Gravity, UA: 1.02 (ref 1.005–1.030)
Urobilinogen, Ur: 0.2 mg/dL (ref 0.2–1.0)
pH, UA: 6.5 (ref 5.0–7.5)

## 2024-01-27 LAB — MICROSCOPIC EXAMINATION: RBC, Urine: >30 /[HPF] — AB (ref 0–2)

## 2024-01-27 MED ORDER — CEPHALEXIN 250 MG PO CAPS
500.0000 mg | ORAL_CAPSULE | Freq: Once | ORAL | Status: AC
  Administered 2024-01-27: 500 mg via ORAL

## 2024-01-27 NOTE — Progress Notes (Signed)
   01/27/24  CC:  Chief Complaint  Patient presents with   Cysto Stent Removal    HPI: 78 year old female status post bilateral prostate brachy who presents today for stent removal  She was given prophylactic antibiotics.  Blood pressure 113/66, pulse 97, height 5' 0.5" (1.537 m), weight 116 lb (52.6 kg). NED. A&Ox3.   No respiratory distress   Abd soft, NT, ND Normal phallus with bilateral descended testicles  Cystoscopy/ Stent removal procedure  Patient identification was confirmed, informed consent was obtained, and patient was prepped using Betadine solution.  Lidocaine jelly was administered per urethral meatus.    Preoperative abx where received prior to procedure.    Procedure: - Flexible cystoscope introduced, without any difficulty.   - Thorough search of the bladder revealed:    normal urethral meatus  Stent seen emanating from left ureteral orifice, grasped with stent graspers, and removed in entirety.    The scope was then reintroduced and the right ureteral stent was removed.  Post-Procedure: - Patient tolerated the procedure well   Assessment/ Plan:  1. Ureteral stone (Primary) Status post ureteroscopy and stent removal  Discussed warning signs  Will arrange f/u 6 weeks with [facility] prior - Urinalysis, Complete - US RENAL; Future   RENAL; Future  3. Prophylactic antibiotic - cephALEXin (KEFLEX) capsule 500 mg    Vanna Scotland, MD

## 2024-03-02 ENCOUNTER — Ambulatory Visit
Admission: RE | Admit: 2024-03-02 | Discharge: 2024-03-02 | Disposition: A | Discharge status: Home / Self Care | Payer: Medicare Other | Source: Ambulatory Visit | Attending: Urology | Admitting: Urology

## 2024-03-02 DIAGNOSIS — N139 Obstructive and reflux uropathy, unspecified: Secondary | ICD-10-CM | POA: Insufficient documentation

## 2024-03-02 DIAGNOSIS — N201 Calculus of ureter: Secondary | ICD-10-CM | POA: Insufficient documentation

## 2024-03-05 ENCOUNTER — Ambulatory Visit: Payer: Medicare Other

## 2024-03-05 DIAGNOSIS — K449 Diaphragmatic hernia without obstruction or gangrene: Secondary | ICD-10-CM | POA: Diagnosis not present

## 2024-03-05 DIAGNOSIS — K222 Esophageal obstruction: Secondary | ICD-10-CM | POA: Diagnosis not present

## 2024-03-05 DIAGNOSIS — R1319 Other dysphagia: Secondary | ICD-10-CM | POA: Diagnosis present

## 2024-03-05 DIAGNOSIS — K219 Gastro-esophageal reflux disease without esophagitis: Secondary | ICD-10-CM | POA: Diagnosis not present

## 2024-03-09 ENCOUNTER — Encounter: Payer: Self-pay | Admitting: Physician Assistant

## 2024-03-09 ENCOUNTER — Ambulatory Visit (INDEPENDENT_AMBULATORY_CARE_PROVIDER_SITE_OTHER): Payer: Self-pay | Admitting: Physician Assistant

## 2024-03-09 VITALS — BP 159/71 | HR 76 | Ht 60.0 in | Wt 114.0 lb

## 2024-03-09 DIAGNOSIS — N201 Calculus of ureter: Secondary | ICD-10-CM

## 2024-03-09 NOTE — Progress Notes (Signed)
 03/09/2024 2:36 PM   CELE MOTE 02-25-1946 [id number]  CC: Chief Complaint  Patient presents with   Follow-up   HPI: NARMEEN KERPER is a 78 y.o. female with PMH nephrolithiasis with a recent history of bilateral ureteral stones s/p bilateral ureteroscopy with laser lithotripsy and stent placement with Dr. Apolinar Junes on 01/19/2024 who underwent cystoscopy stent removal with her on POD 8 who presents today for follow-up.   Today she reports she is doing well with no acute concerns.  She denies flank pain, dysuria, fever, chills, nausea, or vomiting.  Notably, when she initially presented with bilateral ureteral stones, her symptoms were limited to her left side.  Follow-up renal ultrasound from last week shows some persistent right hydronephrosis.  No hydronephrosis seen on the left.  Bilateral ureteral jets are seen.  PMH: Past Medical History:  Diagnosis Date   Acute bilateral obstructive uropathy 11/2023   Anemia    Anxiety    Arthritis    Bilateral carotid artery stenosis    Biliary obstruction    Borderline glaucoma    Cataracts, bilateral    Depression    Diabetes mellitus type 2, uncomplicated (HCC)    Dysrhythmia    HISTORY OF SVT, performed a cardiac ablation   Gastric ulcer due to Helicobacter pylori 2014   GERD (gastroesophageal reflux disease)    Hematuria 04/07/2021   Hepatic cyst 10/2022   History of kidney stones    Hyperlipidemia    Hypothyroidism    Lesion of liver 2016   Paroxysmal atrial fibrillation (HCC)    Personal history of tobacco use, presenting hazards to health 08/24/2015   SVT (supraventricular tachycardia) (HCC) 2014    Surgical History: Past Surgical History:  Procedure Laterality Date   ABDOMINAL HYSTERECTOMY  1984   APPENDECTOMY  1973   CARDIAC ELECTROPHYSIOLOGY MAPPING AND ABLATION  2014   CHOLECYSTECTOMY, LAPAROSCOPIC  04/16/2023   MARSUPIALIZATION OF CYST OR ABSCESS OF LIVER   COLON SURGERY  2008   mass removed  on outside of colon - benign   COLONOSCOPY WITH ESOPHAGOGASTRODUODENOSCOPY (EGD)  2008   CYSTOSCOPY W/ RETROGRADES Bilateral 04/12/2021   Procedure: CYSTOSCOPY WITH RETROGRADE PYELOGRAM;  Surgeon: Vanna Scotland, MD;  Location: [facility];  Service: Urology;  Laterality: Bilateral;   CYSTOSCOPY W/ URETERAL STENT PLACEMENT Bilateral 12/28/2023   Procedure: CYSTOSCOPY WITH STENT PLACEMENT;  Surgeon: Riki Altes, MD;  Location: [facility];  Service: Urology;  Laterality: Bilateral;   CYSTOSCOPY/URETEROSCOPY/HOLMIUM LASER/STENT PLACEMENT Left 04/12/2021   Procedure: CYSTOSCOPY/URETEROSCOPY/HOLMIUM LASER/STENT PLACEMENT;  Surgeon: Vanna Scotland, MD;  Location: [facility];  Service: Urology;  Laterality: Left;   CYSTOSCOPY/URETEROSCOPY/HOLMIUM LASER/STENT PLACEMENT Bilateral 01/19/2024   Procedure: CYSTOSCOPY/URETEROSCOPY/HOLMIUM LASER/STENT EXCHANGE;  Surgeon: Vanna Scotland, MD;  Location: [facility];  Service: Urology;  Laterality: Bilateral;   EYE SURGERY Left 03/13/2021   cataract and vitrectomy   TRIGGER FINGER RELEASE Right 2012   middle finger   URETEROSCOPY Right 04/12/2021   Procedure: URETEROSCOPY;  Surgeon: Vanna Scotland, MD;  Location: [facility];  Service: Urology;  Laterality: Right;    Home Medications:  Allergies as of 03/09/2024       Reactions   Diphenhydramine Hcl Swelling   Increases pressure in the eyes due to glaucoma   Etodolac Other (See Comments)   Chest pain   Omeprazole Other (See Comments)   Chest pain   Tramadol Other (See Comments)   Difficulty breathing   Nickel Rash   Povidone-iodine Rash        Medication  List        Accurate as of March 09, 2024  2:36 PM. If you have any questions, ask your nurse or doctor.          STOP taking these medications    FISH OIL PO Stopped by: Carman Ching       TAKE these medications    aspirin 81 MG chewable tablet Chew 81 mg by mouth daily.   atorvastatin 20 MG tablet Commonly known as:  LIPITOR Take 20 mg by mouth every evening.   citalopram 40 MG tablet Commonly known as: CELEXA Take 40 mg by mouth daily.   lansoprazole 15 MG capsule Commonly known as: PREVACID Take 15 mg by mouth daily at 12 noon.   levothyroxine 75 MCG tablet Commonly known as: SYNTHROID Take 75 mcg by mouth daily before breakfast.   Magnesium Oxide 250 MG Tabs Take 250 mg by mouth daily.   melatonin 3 MG Tabs tablet Take 3 mg by mouth at bedtime as needed (sleep).   Multi-Vitamins Tabs Take 1 tablet by mouth daily.   naproxen sodium 220 MG tablet Commonly known as: ALEVE Take 220 mg by mouth daily as needed.        Allergies:  Allergies  Allergen Reactions   Diphenhydramine Hcl Swelling    Increases pressure in the eyes due to glaucoma   Etodolac Other (See Comments)    Chest pain   Omeprazole Other (See Comments)    Chest pain   Tramadol Other (See Comments)    Difficulty breathing   Nickel Rash   Povidone-Iodine Rash    Family History: Family History  Problem Relation Age of Onset   Breast cancer Neg Hx     Social History:   reports that she quit smoking about 19 years ago. Her smoking use included cigarettes. She started smoking about 59 years ago. She has a 40 pack-year smoking history. She has never used smokeless tobacco. She reports that she does not drink alcohol and does not use drugs.  Physical Exam: BP (!) 159/71   Pulse 76   Ht 5' (1.524 m)   Wt 114 lb (51.7 kg)   BMI 22.26 kg/m   Constitutional:  Alert and oriented, no acute distress, nontoxic appearing HEENT: [place], AT Cardiovascular: No clubbing, cyanosis, or edema Respiratory: Normal respiratory effort, no increased work of breathing Skin: No rashes, bruises or suspicious lesions Neurologic: Grossly intact, no focal deficits, moving all 4 extremities Psychiatric: Normal mood and affect  Assessment & Plan:   1. Ureteral stone (Primary) Right hydronephrosis seen on follow-up renal ultrasound.   She is asymptomatic of this, and while her right renal parenchyma is preserved, she was never symptomatic of her right ureteral stones.  I question an element of chronic obstruction on the right side and suspect that she may have some residual chronic right hydronephrosis.  In the absence of pain, will defer further imaging pending BMP.  If her renal function is elevated over baseline, will recommend CT stone study to evaluate for possible residual stone fragment versus stricture. - Basic metabolic panel  Return for Will call with results.  Carman Ching, PA-C  [facility] [place] [address], [address] [place], [place] [address] [phone]

## 2024-03-10 LAB — BASIC METABOLIC PANEL
BUN/Creatinine Ratio: 20 (ref 12–28)
BUN: 12 mg/dL (ref 8–27)
CO2: 27 mmol/L (ref 20–29)
Calcium: 9.3 mg/dL (ref 8.7–10.3)
Chloride: 101 mmol/L (ref 96–106)
Creatinine, Ser: 0.59 mg/dL (ref 0.57–1.00)
Glucose: 110 mg/dL — ABNORMAL HIGH (ref 70–99)
Potassium: 3.9 mmol/L (ref 3.5–5.2)
Sodium: 141 mmol/L (ref 134–144)
eGFR: 92 mL/min/{1.73_m2} (ref >59)

## 2024-03-18 ENCOUNTER — Encounter: Payer: Self-pay | Admitting: Urology

## 2024-05-18 ENCOUNTER — Encounter (INDEPENDENT_AMBULATORY_CARE_PROVIDER_SITE_OTHER): Payer: Self-pay

## 2024-07-30 ENCOUNTER — Other Ambulatory Visit (INDEPENDENT_AMBULATORY_CARE_PROVIDER_SITE_OTHER): Payer: Self-pay | Admitting: Nurse Practitioner

## 2024-07-30 DIAGNOSIS — I6523 Occlusion and stenosis of bilateral carotid arteries: Secondary | ICD-10-CM

## 2024-08-02 ENCOUNTER — Ambulatory Visit (INDEPENDENT_AMBULATORY_CARE_PROVIDER_SITE_OTHER): Payer: Medicare Other

## 2024-08-02 ENCOUNTER — Ambulatory Visit (INDEPENDENT_AMBULATORY_CARE_PROVIDER_SITE_OTHER): Payer: Medicare Other | Admitting: Vascular Surgery

## 2024-08-02 VITALS — BP 156/73 | HR 72 | Ht 60.0 in | Wt 123.0 lb

## 2024-08-02 DIAGNOSIS — E7800 Pure hypercholesterolemia, unspecified: Secondary | ICD-10-CM

## 2024-08-02 DIAGNOSIS — I6523 Occlusion and stenosis of bilateral carotid arteries: Secondary | ICD-10-CM

## 2024-08-02 DIAGNOSIS — K219 Gastro-esophageal reflux disease without esophagitis: Secondary | ICD-10-CM | POA: Diagnosis not present

## 2024-08-02 DIAGNOSIS — I480 Paroxysmal atrial fibrillation: Secondary | ICD-10-CM

## 2024-08-02 DIAGNOSIS — E119 Type 2 diabetes mellitus without complications: Secondary | ICD-10-CM | POA: Diagnosis not present

## 2024-08-02 NOTE — Progress Notes (Signed)
 MRN : [id number]  Lauren White is a 79 y.o. (1946/05/03) female who presents with chief complaint of check carotid arteries.  History of Present Illness:   The patient is seen for follow up evaluation of carotid stenosis. The carotid stenosis followed by ultrasound.    The patient denies amaurosis fugax. There is no recent history of TIA symptoms or focal motor deficits. There is no prior documented CVA.   The patient is taking enteric-coated aspirin  81 mg daily.   There is no history of migraine headaches. There is no history of seizures.  There have been 2 interval changes in her overall health since her last visit.  She is had 2 separate incidents of kidney stones.  She also had a liver cyst removed with her gallbladder as an urgent surgery.   The patient has a history of coronary artery disease, no recent episodes of angina or shortness of breath. The patient denies PAD or claudication symptoms. There is a history of hyperlipidemia which is being treated with a statin.     Carotid Duplex done today shows <40% stenosis bilaterally.  Apparent decrease compared to last study.  No outpatient medications have been marked as taking for the 08/02/24 encounter (Appointment) with Jama, Cordella MATSU, MD.    Past Medical History:  Diagnosis Date   Acute bilateral obstructive uropathy 11/2023   Anemia    Anxiety    Arthritis    Bilateral carotid artery stenosis    Biliary obstruction    Borderline glaucoma    Cataracts, bilateral    Depression    Diabetes mellitus type 2, uncomplicated (HCC)    Dysrhythmia    HISTORY OF SVT, performed a cardiac ablation   Gastric ulcer due to Helicobacter pylori 2014   GERD (gastroesophageal reflux disease)    Hematuria 04/07/2021   Hepatic cyst 10/2022   History of kidney stones    Hyperlipidemia    Hypothyroidism    Lesion of liver 2016   Paroxysmal atrial fibrillation (HCC)    Personal history of tobacco  use, presenting hazards to health 08/24/2015   SVT (supraventricular tachycardia) (HCC) 2014    Past Surgical History:  Procedure Laterality Date   ABDOMINAL HYSTERECTOMY  1984   APPENDECTOMY  1973   CARDIAC ELECTROPHYSIOLOGY MAPPING AND ABLATION  2014   CHOLECYSTECTOMY, LAPAROSCOPIC  04/16/2023   MARSUPIALIZATION OF CYST OR ABSCESS OF LIVER   COLON SURGERY  2008   mass removed on outside of colon - benign   COLONOSCOPY WITH ESOPHAGOGASTRODUODENOSCOPY (EGD)  2008   CYSTOSCOPY W/ RETROGRADES Bilateral 04/12/2021   Procedure: CYSTOSCOPY WITH RETROGRADE PYELOGRAM;  Surgeon: Penne Knee, MD;  Location: [facility];  Service: Urology;  Laterality: Bilateral;   CYSTOSCOPY W/ URETERAL STENT PLACEMENT Bilateral 12/28/2023   Procedure: CYSTOSCOPY WITH STENT PLACEMENT;  Surgeon: Twylla Glendia BROCKS, MD;  Location: [facility];  Service: Urology;  Laterality: Bilateral;   CYSTOSCOPY/URETEROSCOPY/HOLMIUM LASER/STENT PLACEMENT Left 04/12/2021   Procedure: CYSTOSCOPY/URETEROSCOPY/HOLMIUM LASER/STENT PLACEMENT;  Surgeon: Penne Knee, MD;  Location: [facility];  Service: Urology;  Laterality: Left;   CYSTOSCOPY/URETEROSCOPY/HOLMIUM LASER/STENT PLACEMENT Bilateral 01/19/2024   Procedure: CYSTOSCOPY/URETEROSCOPY/HOLMIUM LASER/STENT EXCHANGE;  Surgeon: Penne Knee, MD;  Location: [facility];  Service: Urology;  Laterality: Bilateral;   EYE SURGERY Left 03/13/2021   cataract and vitrectomy   TRIGGER FINGER RELEASE Right 2012   middle finger   URETEROSCOPY  Right 04/12/2021   Procedure: URETEROSCOPY;  Surgeon: Penne Knee, MD;  Location: [facility];  Service: Urology;  Laterality: Right;    Social History Social History   Tobacco Use   Smoking status: Former    Current packs/day: 0.00    Average packs/day: 1 pack/day for 40.0 years (40.0 ttl pk-yrs)    Types: Cigarettes    Start date: 1966    Quit date: 2006    Years since quitting: 19.6   Smokeless tobacco: Never   Tobacco comments:    quit  2006  Vaping Use   Vaping status: Never Used  Substance Use Topics   Alcohol use: No   Drug use: No    Family History Family History  Problem Relation Age of Onset   Breast cancer Neg Hx     Allergies  Allergen Reactions   Diphenhydramine Hcl Swelling    Increases pressure in the eyes due to glaucoma   Etodolac Other (See Comments)    Chest pain   Omeprazole Other (See Comments)    Chest pain   Tramadol Other (See Comments)    Difficulty breathing   Nickel Rash   Povidone-Iodine Rash     REVIEW OF SYSTEMS (Negative unless checked)  Constitutional: [] Weight loss  [] Fever  [] Chills Cardiac: [] Chest pain   [] Chest pressure   [] Palpitations   [] Shortness of breath when laying flat   [] Shortness of breath with exertion. Vascular:  [x] Pain in legs with walking   [] Pain in legs at rest  [] History of DVT   [] Phlebitis   [] Swelling in legs   [] Varicose veins   [] Non-healing ulcers Pulmonary:   [] Uses home oxygen   [] Productive cough   [] Hemoptysis   [] Wheeze  [] COPD   [] Asthma Neurologic:  [] Dizziness   [] Seizures   [] History of stroke   [] History of TIA  [] Aphasia   [] Vissual changes   [] Weakness or numbness in arm   [] Weakness or numbness in leg Musculoskeletal:   [] Joint swelling   [] Joint pain   [] Low back pain Hematologic:  [] Easy bruising  [] Easy bleeding   [] Hypercoagulable state   [] Anemic Gastrointestinal:  [] Diarrhea   [] Vomiting  [x] Gastroesophageal reflux/heartburn   [] Difficulty swallowing. Genitourinary:  [] Chronic kidney disease   [] Difficult urination  [] Frequent urination   [] Blood in urine Skin:  [] Rashes   [] Ulcers  Psychological:  [] History of anxiety   []  History of major depression.  Physical Examination  There were no vitals filed for this visit. There is no height or weight on file to calculate BMI. Gen: WD/WN, NAD Head: [place]/AT, No temporalis wasting.  Ear/Nose/Throat: Hearing grossly intact, nares w/o erythema or drainage Eyes: PER, EOMI, sclera  nonicteric.  Neck: Supple, no masses.  No bruit or JVD.  Pulmonary:  Good air movement, no audible wheezing, no use of accessory muscles.  Cardiac: RRR, normal S1, S2, no Murmurs. Vascular:  carotid bruit noted Vessel Right Left  Radial Palpable Palpable  Carotid  Palpable  Palpable  Subclav  Palpable Palpable  Gastrointestinal: soft, non-distended. No guarding/no peritoneal signs.  Musculoskeletal: M/S 5/5 throughout.  No visible deformity.  Neurologic: CN 2-12 intact. Pain and light touch intact in extremities.  Symmetrical.  Speech is fluent. Motor exam as listed above. Psychiatric: Judgment intact, Mood & affect appropriate for pt's clinical situation. Dermatologic: No rashes or ulcers noted.  No changes consistent with cellulitis.   CBC Lab Results  Component Value Date   WBC 9.7 01/14/2024   HGB 11.7 (L) 01/14/2024  HCT 36.0 01/14/2024   MCV 87.0 01/14/2024   PLT 271 01/14/2024    BMET    Component Value Date/Time   NA 141 03/09/2024 1445   NA 141 01/11/2013 1558   K 3.9 03/09/2024 1445   K 3.9 01/11/2013 1558   CL 101 03/09/2024 1445   CL 108 (H) 01/11/2013 1558   CO2 27 03/09/2024 1445   CO2 25 01/11/2013 1558   GLUCOSE 110 (H) 03/09/2024 1445   GLUCOSE 131 (H) 01/14/2024 1357   GLUCOSE 108 (H) 01/11/2013 1558   BUN 12 03/09/2024 1445   BUN 15 01/11/2013 1558   CREATININE 0.59 03/09/2024 1445   CREATININE 0.70 01/11/2013 1558   CALCIUM  9.3 03/09/2024 1445   CALCIUM  8.9 01/11/2013 1558   GFRNONAA >60 01/14/2024 1357   GFRNONAA >60 01/11/2013 1558   GFRAA >60 01/11/2013 1558   CrCl cannot be calculated (Patient's most recent lab result is older than the maximum 21 days allowed.).  COAG No results found for: INR, PROTIME  Radiology No results found.   Assessment/Plan 1. Bilateral carotid artery stenosis (Primary) Recommend:   Given the patient's asymptomatic subcritical stenosis no further invasive testing or surgery at this time.   Duplex  ultrasound shows 1-39% stenosis bilaterally.   Continue antiplatelet therapy as prescribed Continue management of CAD, HTN and Hyperlipidemia Healthy heart diet,  encouraged exercise at least 4 times per week Follow up in 24 months with duplex ultrasound and physical exam   - VAS US  CAROTID; Future  2. Paroxysmal atrial fibrillation (HCC) Continue antiarrhythmia medications as already ordered, these medications have been reviewed and there are no changes at this time.  Continue anticoagulation as ordered by Cardiology Service  3. Gastroesophageal reflux disease without esophagitis Continue PPI as already ordered, this medication has been reviewed and there are no changes at this time.  Avoidence of caffeine and alcohol  Moderate elevation of the head of the bed   4. Type 2 diabetes mellitus without complication, unspecified whether long term insulin  use (HCC) Continue hypoglycemic medications as already ordered, these medications have been reviewed and there are no changes at this time.  Hgb A1C to be monitored as already arranged by primary service  5. Pure hypercholesterolemia Continue statin as ordered and reviewed, no changes at this time    Cordella Shawl, MD  08/02/2024 11:39 AM

## 2024-08-07 ENCOUNTER — Encounter (INDEPENDENT_AMBULATORY_CARE_PROVIDER_SITE_OTHER): Payer: Self-pay | Admitting: Vascular Surgery

## 2024-09-06 ENCOUNTER — Other Ambulatory Visit: Payer: Self-pay | Admitting: Internal Medicine

## 2024-09-06 DIAGNOSIS — Z1231 Encounter for screening mammogram for malignant neoplasm of breast: Secondary | ICD-10-CM

## 2024-09-28 ENCOUNTER — Encounter

## 2024-10-26 ENCOUNTER — Ambulatory Visit
Admission: RE | Admit: 2024-10-26 | Discharge: 2024-10-26 | Disposition: A | Discharge status: Home / Self Care | Source: Ambulatory Visit | Attending: Internal Medicine | Admitting: Internal Medicine

## 2024-10-26 DIAGNOSIS — Z1231 Encounter for screening mammogram for malignant neoplasm of breast: Secondary | ICD-10-CM | POA: Diagnosis present

## 2026-07-31 ENCOUNTER — Encounter (INDEPENDENT_AMBULATORY_CARE_PROVIDER_SITE_OTHER)

## 2026-07-31 ENCOUNTER — Ambulatory Visit (INDEPENDENT_AMBULATORY_CARE_PROVIDER_SITE_OTHER): Admitting: Vascular Surgery
